# Patient Record
Sex: Male | Born: 1935 | Race: White | Hispanic: No | Marital: Married | Smoking: Never smoker
Health system: Southern US, Community
[De-identification: ages and names within clinical notes are randomized; demographics above are authoritative.]

## PROBLEM LIST (undated history)

## (undated) DIAGNOSIS — M5136 Other intervertebral disc degeneration, lumbar region: Secondary | ICD-10-CM

## (undated) DIAGNOSIS — R413 Other amnesia: Secondary | ICD-10-CM

## (undated) HISTORY — DX: Other intervertebral disc degeneration, lumbar region: M51.36

## (undated) HISTORY — DX: Other amnesia: R41.3

---

## 2013-05-09 ENCOUNTER — Emergency Department
Admission: EM | Admit: 2013-05-09 | Discharge: 2013-05-09 | Disposition: A | Discharge status: Home / Self Care | Payer: Commercial Managed Care - HMO | Source: Home / Self Care

## 2013-05-10 ENCOUNTER — Ambulatory Visit (INDEPENDENT_AMBULATORY_CARE_PROVIDER_SITE_OTHER): Payer: Medicare HMO | Admitting: Sports Medicine

## 2013-05-10 ENCOUNTER — Ambulatory Visit (INDEPENDENT_AMBULATORY_CARE_PROVIDER_SITE_OTHER): Payer: Medicare HMO

## 2013-05-10 ENCOUNTER — Encounter: Payer: Self-pay | Admitting: Sports Medicine

## 2013-05-10 VITALS — BP 115/67 | HR 82 | Ht 72.0 in | Wt 199.0 lb

## 2013-05-10 DIAGNOSIS — M545 Low back pain, unspecified: Secondary | ICD-10-CM

## 2013-05-10 DIAGNOSIS — M5136 Other intervertebral disc degeneration, lumbar region: Secondary | ICD-10-CM

## 2013-05-10 DIAGNOSIS — IMO0002 Reserved for concepts with insufficient information to code with codable children: Secondary | ICD-10-CM

## 2013-05-10 DIAGNOSIS — M51369 Other intervertebral disc degeneration, lumbar region without mention of lumbar back pain or lower extremity pain: Secondary | ICD-10-CM | POA: Insufficient documentation

## 2013-05-10 DIAGNOSIS — M47817 Spondylosis without myelopathy or radiculopathy, lumbosacral region: Secondary | ICD-10-CM

## 2013-05-10 HISTORY — DX: Other intervertebral disc degeneration, lumbar region without mention of lumbar back pain or lower extremity pain: M51.369

## 2013-05-10 HISTORY — DX: Other intervertebral disc degeneration, lumbar region: M51.36

## 2013-05-10 MED ORDER — PREDNISONE 50 MG PO TABS: ORAL_TABLET | ORAL | Status: DC

## 2013-05-10 MED ORDER — MELOXICAM 15 MG PO TABS: ORAL_TABLET | ORAL | Status: DC

## 2013-05-10 NOTE — Progress Notes (Signed)
   Subjective:    [name] seeing this patient as a consultation for:  Dr. Alvester MorinNewton  CC: Low back pain  HPI: For decades this pleasant 80 year old male has noted pain in his low back worse with standing and twisting,male is in pain he localizes in the midline of his low back, he denies any radicular symptoms, no trauma, no constitutional symptoms. He is really not tried anything yet for treatment. It is not worse with sitting in a car or bending forward or with Valsalva.  Past medical history, Surgical history, Family history not pertinant except as noted below, Social history, Allergies, and medications have been entered into the medical record, reviewed, and no changes needed.   Review of Systems: No headache, visual changes, nausea, vomiting, diarrhea, constipation, dizziness, abdominal pain, skin rash, fevers, chills, night sweats, weight loss, swollen lymph nodes, body aches, joint swelling, muscle aches, chest pain, shortness of breath, mood changes, visual or auditory hallucinations.   Objective:   General: Well Developed, well nourished, and in no acute distress.  Neuro/Psych: Alert and oriented x3, extra-ocular muscles intact, able to move all 4 extremities, sensation grossly intact. Skin: Warm and dry, no rashes noted.  Respiratory: Not using accessory muscles, speaking in full sentences, trachea midline.  Cardiovascular: Pulses palpable, no extremity edema. Abdomen: Does not appear distended. Back Exam:  Inspection: Unremarkable  Motion: Flexion 45 deg, Extension 45 deg, Side Bending to 45 deg bilaterally,  Rotation to 45 deg bilaterally  SLR laying: Negative  XSLR laying: Negative  Palpable tenderness: None. FABER: negative. Sensory change: Gross sensation intact to all lumbar and sacral dermatomes.  Reflexes: 2+ at both patellar tendons, 2+ at achilles tendons, Babinski's downgoing.  Strength at foot  Plantar-flexion: 5/5 Dorsi-flexion: 5/5 Eversion: 5/5 Inversion: 5/5  Leg  strength  Quad: 5/5 Hamstring: 5/5 Hip flexor: 5/5 Hip abductors: 5/5  Gait unremarkable.   x-rays were reviewed and do show multilevel degenerative changes.   Impression and Recommendations:   This case required medical decision making of moderate complexity.

## 2013-05-10 NOTE — Assessment & Plan Note (Signed)
Symptoms are likely facetogenic predominantly axial with radiation only down to the posterior thigh. We will start conservatively with Mobic, prednisone, formal physical therapy, and x-rays. Return to see me in one month, MRI if no better.

## 2013-05-21 ENCOUNTER — Ambulatory Visit: Payer: Medicare HMO | Admitting: Physical Therapy

## 2013-05-28 ENCOUNTER — Ambulatory Visit (INDEPENDENT_AMBULATORY_CARE_PROVIDER_SITE_OTHER): Payer: Medicare HMO | Admitting: Physical Therapy

## 2013-05-28 DIAGNOSIS — M6281 Muscle weakness (generalized): Secondary | ICD-10-CM

## 2013-05-28 DIAGNOSIS — M545 Low back pain, unspecified: Secondary | ICD-10-CM

## 2013-05-28 DIAGNOSIS — M25659 Stiffness of unspecified hip, not elsewhere classified: Secondary | ICD-10-CM

## 2013-05-30 ENCOUNTER — Encounter (INDEPENDENT_AMBULATORY_CARE_PROVIDER_SITE_OTHER): Payer: Medicare HMO

## 2013-05-30 DIAGNOSIS — M545 Low back pain, unspecified: Secondary | ICD-10-CM

## 2013-05-30 DIAGNOSIS — M25659 Stiffness of unspecified hip, not elsewhere classified: Secondary | ICD-10-CM

## 2013-05-30 DIAGNOSIS — M6281 Muscle weakness (generalized): Secondary | ICD-10-CM

## 2013-06-03 ENCOUNTER — Encounter (INDEPENDENT_AMBULATORY_CARE_PROVIDER_SITE_OTHER): Payer: Medicare HMO | Admitting: Physical Therapy

## 2013-06-03 DIAGNOSIS — M6281 Muscle weakness (generalized): Secondary | ICD-10-CM

## 2013-06-03 DIAGNOSIS — M25659 Stiffness of unspecified hip, not elsewhere classified: Secondary | ICD-10-CM

## 2013-06-03 DIAGNOSIS — M545 Low back pain, unspecified: Secondary | ICD-10-CM

## 2013-06-06 ENCOUNTER — Encounter (INDEPENDENT_AMBULATORY_CARE_PROVIDER_SITE_OTHER): Payer: Medicare HMO | Admitting: Physical Therapy

## 2013-06-06 DIAGNOSIS — M545 Low back pain, unspecified: Secondary | ICD-10-CM

## 2013-06-06 DIAGNOSIS — M6281 Muscle weakness (generalized): Secondary | ICD-10-CM

## 2013-06-06 DIAGNOSIS — M25659 Stiffness of unspecified hip, not elsewhere classified: Secondary | ICD-10-CM

## 2013-06-07 ENCOUNTER — Encounter: Payer: Self-pay | Admitting: Sports Medicine

## 2013-06-07 ENCOUNTER — Ambulatory Visit (INDEPENDENT_AMBULATORY_CARE_PROVIDER_SITE_OTHER): Payer: Medicare HMO | Admitting: Sports Medicine

## 2013-06-07 VITALS — BP 122/74 | HR 78 | Ht 72.0 in | Wt 195.0 lb

## 2013-06-07 DIAGNOSIS — M545 Low back pain, unspecified: Secondary | ICD-10-CM

## 2013-06-07 DIAGNOSIS — IMO0001 Reserved for inherently not codable concepts without codable children: Secondary | ICD-10-CM

## 2013-06-07 NOTE — Progress Notes (Signed)
  Subjective:    CC: Followup  HPI: Axial low back pain: Initially symptoms predominately facetogenic, he has been through steroids, NSAIDs, muscle relaxers, and formal physical therapy, he continues to improve, only has mild pain he localizes on the right side of the low back. No constitutional symptoms, no radicular symptoms, mild, improving.  Past medical history, Surgical history, Family history not pertinant except as noted below, Social history, Allergies, and medications have been entered into the medical record, reviewed, and no changes needed.   Review of Systems: No fevers, chills, night sweats, weight loss, chest pain, or shortness of breath.   Objective:    General: Well Developed, well nourished, and in no acute distress.  Neuro: Alert and oriented x3, extra-ocular muscles intact, sensation grossly intact.  HEENT: Normocephalic, atraumatic, pupils equal round reactive to light, neck supple, no masses, no lymphadenopathy, thyroid nonpalpable.  Skin: Warm and dry, no rashes. Cardiac: Regular rate and rhythm, no murmurs rubs or gallops, no lower extremity edema.  Respiratory: Clear to auscultation bilaterally. Not using accessory muscles, speaking in full sentences. Back Exam:  Inspection: Unremarkable  Motion: Flexion 45 deg, Extension 45 deg, Side Bending to 45 deg bilaterally,  Rotation to 45 deg bilaterally  SLR laying: Negative  XSLR laying: Negative  Palpable tenderness: Right sided lower lumbar spine likely over the L5-S1 facet joint. FABER: negative. Sensory change: Gross sensation intact to all lumbar and sacral dermatomes.  Reflexes: 2+ at both patellar tendons, 2+ at achilles tendons, Babinski's downgoing.  Strength at foot  Plantar-flexion: 5/5 Dorsi-flexion: 5/5 Eversion: 5/5 Inversion: 5/5  Leg strength  Quad: 5/5 Hamstring: 5/5 Hip flexor: 5/5 Hip abductors: 5/5  Gait unremarkable.  Procedure:  Injection of right paraspinal trigger point Consent obtained  and verified. Time-out conducted. Noted no overlying erythema, induration, or other signs of local infection. Skin prepped in a sterile fashion. Topical analgesic spray: Ethyl chloride. Completed without difficulty. Meds: 0.5 cc Kenalog 40, 1 cc lidocaine injected in a fanlike pattern into the trigger point overlying the right sided L5-S1 facet joint.  Pain immediately improved suggesting accurate placement of the medication. Advised to call if fevers/chills, erythema, induration, drainage, or persistent bleeding.  Impression and Recommendations:

## 2013-06-07 NOTE — Assessment & Plan Note (Signed)
Pain continues to be axial and likely facet mediated. He does have discrete tenderness to palpation of the right L5-S1 versus L4-L5 facet joint. Injection, trigger point placed in the overlying tissues. He still has a bit more physical therapy and continues to improve, I like to see him back in an additional 3 weeks. If no better we will get an MRI for facet joint injection planning.

## 2013-06-11 ENCOUNTER — Encounter: Payer: Medicare HMO | Admitting: Physical Therapy

## 2013-06-13 ENCOUNTER — Encounter (INDEPENDENT_AMBULATORY_CARE_PROVIDER_SITE_OTHER): Payer: Medicare HMO | Admitting: Physical Therapy

## 2013-06-13 DIAGNOSIS — M545 Low back pain, unspecified: Secondary | ICD-10-CM

## 2013-06-13 DIAGNOSIS — M25659 Stiffness of unspecified hip, not elsewhere classified: Secondary | ICD-10-CM

## 2013-06-13 DIAGNOSIS — M6281 Muscle weakness (generalized): Secondary | ICD-10-CM

## 2013-06-19 ENCOUNTER — Encounter (INDEPENDENT_AMBULATORY_CARE_PROVIDER_SITE_OTHER): Payer: Medicare HMO | Admitting: Physical Therapy

## 2013-06-19 DIAGNOSIS — M25659 Stiffness of unspecified hip, not elsewhere classified: Secondary | ICD-10-CM

## 2013-06-19 DIAGNOSIS — M6281 Muscle weakness (generalized): Secondary | ICD-10-CM

## 2013-06-19 DIAGNOSIS — M545 Low back pain, unspecified: Secondary | ICD-10-CM

## 2013-06-20 ENCOUNTER — Encounter: Payer: Medicare HMO | Admitting: Physical Therapy

## 2013-06-21 ENCOUNTER — Encounter: Payer: Medicare HMO | Admitting: Physical Therapy

## 2013-06-21 ENCOUNTER — Ambulatory Visit (INDEPENDENT_AMBULATORY_CARE_PROVIDER_SITE_OTHER): Payer: Medicare HMO | Admitting: Sports Medicine

## 2013-06-21 ENCOUNTER — Encounter: Payer: Self-pay | Admitting: Sports Medicine

## 2013-06-21 VITALS — BP 102/59 | HR 90 | Ht 72.0 in | Wt 194.0 lb

## 2013-06-21 DIAGNOSIS — M545 Low back pain, unspecified: Secondary | ICD-10-CM

## 2013-06-21 MED ORDER — HYDROCODONE-ACETAMINOPHEN 5-325 MG PO TABS: 1.0000 | ORAL_TABLET | Freq: Three times a day (TID) | ORAL | Status: DC | PRN

## 2013-06-21 NOTE — Assessment & Plan Note (Signed)
Pain continue to be axial and clinically predominately facetogenic. He has been through physical therapy unfortunately pain is worsening, I am going to add a very small course of hydrocodone, and we are going to get an MRI for interventional injection planning.

## 2013-06-21 NOTE — Progress Notes (Signed)
  Subjective:    CC: Follow up  HPI: Juan Carrillo is a pleasant 80 year old male, he has axial, predominantly facetogenic low back pain, we placed him through physical therapy, unfortunately 2 weeks later he is having worsening pain despite all of the oral medications that have been prescribed. Pain continue to be moderate and persistent, he is requesting hydrocodone today.  Past medical history, Surgical history, Family history not pertinant except as noted below, Social history, Allergies, and medications have been entered into the medical record, reviewed, and no changes needed.   Review of Systems: No fevers, chills, night sweats, weight loss, chest pain, or shortness of breath.   Objective:    General: Well Developed, well nourished, and in no acute distress.  Neuro: Alert and oriented x3, extra-ocular muscles intact, sensation grossly intact.  HEENT: Normocephalic, atraumatic, pupils equal round reactive to light, neck supple, no masses, no lymphadenopathy, thyroid nonpalpable.  Skin: Warm and dry, no rashes. Cardiac: Regular rate and rhythm, no murmurs rubs or gallops, no lower extremity edema.  Respiratory: Clear to auscultation bilaterally. Not using accessory muscles, speaking in full sentences.  Impression and Recommendations:

## 2013-06-24 ENCOUNTER — Encounter: Payer: Medicare HMO | Admitting: Physical Therapy

## 2013-06-26 ENCOUNTER — Telehealth: Payer: Self-pay | Admitting: *Deleted

## 2013-06-26 NOTE — Telephone Encounter (Signed)
Precert obtained for MRI Lumbar Spine without contrast through [facility].  Auth # [id number] good from 06/26/2013 to 07/26/2013.  Imaging notified. Barry DienesKimberly Elyssia Strausser, LPN

## 2013-07-01 ENCOUNTER — Ambulatory Visit: Payer: Medicare HMO | Admitting: Family Medicine

## 2013-07-10 ENCOUNTER — Ambulatory Visit (INDEPENDENT_AMBULATORY_CARE_PROVIDER_SITE_OTHER): Payer: Medicare HMO

## 2013-07-10 DIAGNOSIS — M79609 Pain in unspecified limb: Secondary | ICD-10-CM

## 2013-07-10 DIAGNOSIS — M545 Low back pain, unspecified: Secondary | ICD-10-CM

## 2013-07-25 ENCOUNTER — Ambulatory Visit (INDEPENDENT_AMBULATORY_CARE_PROVIDER_SITE_OTHER): Payer: Medicare HMO | Admitting: Sports Medicine

## 2013-07-25 ENCOUNTER — Encounter: Payer: Self-pay | Admitting: Sports Medicine

## 2013-07-25 VITALS — BP 89/55 | HR 90 | Ht 71.0 in | Wt 192.0 lb

## 2013-07-25 DIAGNOSIS — M51379 Other intervertebral disc degeneration, lumbosacral region without mention of lumbar back pain or lower extremity pain: Secondary | ICD-10-CM

## 2013-07-25 DIAGNOSIS — M5137 Other intervertebral disc degeneration, lumbosacral region: Secondary | ICD-10-CM

## 2013-07-25 DIAGNOSIS — M51369 Other intervertebral disc degeneration, lumbar region without mention of lumbar back pain or lower extremity pain: Secondary | ICD-10-CM

## 2013-07-25 DIAGNOSIS — M5136 Other intervertebral disc degeneration, lumbar region: Secondary | ICD-10-CM

## 2013-07-25 MED ORDER — HYDROCODONE-ACETAMINOPHEN 5-325 MG PO TABS: 1.0000 | ORAL_TABLET | Freq: Three times a day (TID) | ORAL | Status: DC | PRN

## 2013-07-25 NOTE — Assessment & Plan Note (Signed)
Juan Carrillo has persistent axial and discogenic low back pain. At this point he has failed conservative measures and we are going to proceed with intervention. This will take place as a right-sided L2-L3 and L4-L5 interlaminar epidural injections.  Ideally these will be done on Monday, Jul 29, 2013 Like to see him back 2 weeks after the injection to evaluate his response.

## 2013-07-25 NOTE — Progress Notes (Signed)
  Subjective:    CC: MRI results  HPI: This pleasant [id number] year old male has lumbar degenerative disc disease. He failed physical therapy, steroids, NSAIDs, we recently obtained an MRI, results of which will be dictated below. Pain is moderate, persistent, axial, worse with sitting and Valsalva.  Past medical history, Surgical history, Family history not pertinant except as noted below, Social history, Allergies, and medications have been entered into the medical record, reviewed, and no changes needed.   Review of Systems: No fevers, chills, night sweats, weight loss, chest pain, or shortness of breath.   Objective:    General: Well Developed, well nourished, and in no acute distress.  Neuro: Alert and oriented x3, extra-ocular muscles intact, sensation grossly intact.  HEENT: Normocephalic, atraumatic, pupils equal round reactive to light, neck supple, no masses, no lymphadenopathy, thyroid nonpalpable.  Skin: Warm and dry, no rashes. Cardiac: Regular rate and rhythm, no murmurs rubs or gallops, no lower extremity edema.  Respiratory: Clear to auscultation bilaterally. Not using accessory muscles, speaking in full sentences.  MRI was reviewed and shows multilevel degenerative disc disease with predominant central spinal stenosis at L2-3, L4-5, and L5-S1.  Impression and Recommendations:

## 2013-08-06 ENCOUNTER — Other Ambulatory Visit: Payer: Self-pay | Admitting: Sports Medicine

## 2013-08-06 ENCOUNTER — Ambulatory Visit
Admission: RE | Admit: 2013-08-06 | Discharge: 2013-08-06 | Disposition: A | Discharge status: Home / Self Care | Payer: Commercial Managed Care - HMO | Source: Ambulatory Visit | Attending: Sports Medicine | Admitting: Sports Medicine

## 2013-08-06 DIAGNOSIS — M5136 Other intervertebral disc degeneration, lumbar region: Secondary | ICD-10-CM

## 2013-08-06 MED ORDER — METHYLPREDNISOLONE ACETATE 40 MG/ML INJ SUSP (RADIOLOG
120.0000 mg | Freq: Once | INTRAMUSCULAR | Status: AC
  Administered 2013-08-06: 120 mg via EPIDURAL

## 2013-08-06 MED ORDER — IOHEXOL 180 MG/ML  SOLN
1.0000 mL | Freq: Once | INTRAMUSCULAR | Status: AC | PRN
  Administered 2013-08-06: 1 mL via EPIDURAL

## 2013-08-06 NOTE — Discharge Instructions (Signed)

## 2013-08-12 ENCOUNTER — Ambulatory Visit: Payer: Medicare HMO | Admitting: Sports Medicine

## 2013-10-07 ENCOUNTER — Encounter: Payer: Self-pay | Admitting: Sports Medicine

## 2013-10-07 ENCOUNTER — Ambulatory Visit (INDEPENDENT_AMBULATORY_CARE_PROVIDER_SITE_OTHER): Payer: Medicare HMO | Admitting: Sports Medicine

## 2013-10-07 VITALS — BP 103/57 | HR 95 | Ht 71.0 in | Wt 195.0 lb

## 2013-10-07 DIAGNOSIS — M51369 Other intervertebral disc degeneration, lumbar region without mention of lumbar back pain or lower extremity pain: Secondary | ICD-10-CM

## 2013-10-07 DIAGNOSIS — M51379 Other intervertebral disc degeneration, lumbosacral region without mention of lumbar back pain or lower extremity pain: Secondary | ICD-10-CM

## 2013-10-07 DIAGNOSIS — M5137 Other intervertebral disc degeneration, lumbosacral region: Secondary | ICD-10-CM

## 2013-10-07 DIAGNOSIS — M5136 Other intervertebral disc degeneration, lumbar region: Secondary | ICD-10-CM

## 2013-10-07 MED ORDER — HYDROCODONE-ACETAMINOPHEN 5-325 MG PO TABS: 1.0000 | ORAL_TABLET | Freq: Three times a day (TID) | ORAL | Status: DC | PRN

## 2013-10-07 MED ORDER — MELOXICAM 15 MG PO TABS: ORAL_TABLET | ORAL | Status: DC

## 2013-10-07 NOTE — Progress Notes (Signed)
  Subjective:    CC: Followup  HPI: This is a very pleasant 80 year old male with lumbar degenerative disc disease, approximately 3 months ago he had an L2-L3 and L4-L5 interlaminar epidural injection, he had a fantastic response and desires a repeat. He is only taking a single hydrocodone per day, with meloxicam and feels as though this is a good regimen for him. Symptoms are moderate, persistent.  Past medical history, Surgical history, Family history not pertinant except as noted below, Social history, Allergies, and medications have been entered into the medical record, reviewed, and no changes needed.   Review of Systems: No fevers, chills, night sweats, weight loss, chest pain, or shortness of breath.   Objective:    General: Well Developed, well nourished, and in no acute distress.  Neuro: Alert and oriented x3, extra-ocular muscles intact, sensation grossly intact.  HEENT: Normocephalic, atraumatic, pupils equal round reactive to light, neck supple, no masses, no lymphadenopathy, thyroid nonpalpable.  Skin: Warm and dry, no rashes. Cardiac: Regular rate and rhythm, no murmurs rubs or gallops, no lower extremity edema.  Respiratory: Clear to auscultation bilaterally. Not using accessory muscles, speaking in full sentences. Back Exam:  Inspection: Unremarkable  Motion: Flexion 45 deg, Extension 45 deg, Side Bending to 45 deg bilaterally,  Rotation to 45 deg bilaterally  SLR laying: Negative  XSLR laying: Negative  Palpable tenderness: None. FABER: negative. Sensory change: Gross sensation intact to all lumbar and sacral dermatomes.  Reflexes: 2+ at both patellar tendons, 2+ at achilles tendons, Babinski's downgoing.  Strength at foot  Plantar-flexion: 5/5 Dorsi-flexion: 5/5 Eversion: 5/5 Inversion: 5/5  Leg strength  Quad: 5/5 Hamstring: 5/5 Hip flexor: 5/5 Hip abductors: 5/5  Gait unremarkable.  Impression and Recommendations:

## 2013-10-07 NOTE — Assessment & Plan Note (Signed)
Excellent response, with 3 months of relief after a right-sided L2-L3 and L4-L5 interlaminar epidural. We are going to repeat the epidurals, we can do 6 of these in one year. Also refilling meloxicam and hydrocodone for use up to one time per day.

## 2013-10-16 ENCOUNTER — Other Ambulatory Visit: Payer: Commercial Managed Care - HMO

## 2013-10-17 ENCOUNTER — Other Ambulatory Visit: Payer: Self-pay | Admitting: Sports Medicine

## 2013-10-17 ENCOUNTER — Ambulatory Visit
Admission: RE | Admit: 2013-10-17 | Discharge: 2013-10-17 | Disposition: A | Discharge status: Home / Self Care | Payer: Commercial Managed Care - HMO | Source: Ambulatory Visit | Attending: Sports Medicine | Admitting: Sports Medicine

## 2013-10-17 DIAGNOSIS — M5136 Other intervertebral disc degeneration, lumbar region: Secondary | ICD-10-CM

## 2013-10-17 MED ORDER — METHYLPREDNISOLONE ACETATE 40 MG/ML INJ SUSP (RADIOLOG
120.0000 mg | Freq: Once | INTRAMUSCULAR | Status: AC
  Administered 2013-10-17: 120 mg via EPIDURAL

## 2013-10-17 MED ORDER — IOHEXOL 180 MG/ML  SOLN
1.0000 mL | Freq: Once | INTRAMUSCULAR | Status: AC | PRN
  Administered 2013-10-17: 1 mL via EPIDURAL

## 2015-06-02 DIAGNOSIS — R413 Other amnesia: Secondary | ICD-10-CM

## 2015-06-02 HISTORY — DX: Other amnesia: R41.3

## 2015-12-10 ENCOUNTER — Encounter: Payer: Self-pay | Admitting: Sports Medicine

## 2015-12-10 ENCOUNTER — Ambulatory Visit (INDEPENDENT_AMBULATORY_CARE_PROVIDER_SITE_OTHER): Payer: Commercial Managed Care - HMO | Admitting: Sports Medicine

## 2015-12-10 DIAGNOSIS — M5136 Other intervertebral disc degeneration, lumbar region: Secondary | ICD-10-CM

## 2015-12-10 DIAGNOSIS — M51369 Other intervertebral disc degeneration, lumbar region without mention of lumbar back pain or lower extremity pain: Secondary | ICD-10-CM

## 2015-12-10 MED ORDER — PREDNISONE 50 MG PO TABS: 50.0000 mg | ORAL_TABLET | Freq: Every day | ORAL | 0 refills | Status: DC

## 2015-12-10 NOTE — Assessment & Plan Note (Signed)
Good relief after right-sided L2-L3 and L4-L5 interlaminar epidurals 2 years ago. Now having a recurrence of pain, likely radicular on the left in an L4 versus an L5 distribution. Continue meloxicam, adding prednisone for 5 days. I am happy to proceed with an epidural should he desire.

## 2015-12-10 NOTE — Progress Notes (Signed)
  Subjective:    CC: Follow-up  HPI: Left thigh pain: History of fairly severe lumbar degenerative disc disease and facet arthritis. Really has no back pain, pain is moderate, persistent and localized in the lateral and anterior left thigh, mid femur. No bowel or bladder dysfunction, saddle numbness, constitutional symptoms.  Past medical history, Surgical history, Family history not pertinant except as noted below, Social history, Allergies, and medications have been entered into the medical record, reviewed, and no changes needed.   Review of Systems: No fevers, chills, night sweats, weight loss, chest pain, or shortness of breath.   Objective:    General: Well Developed, well nourished, and in no acute distress.  Neuro: Alert and oriented x3, extra-ocular muscles intact, sensation grossly intact.  HEENT: Normocephalic, atraumatic, pupils equal round reactive to light, neck supple, no masses, no lymphadenopathy, thyroid nonpalpable.  Skin: Warm and dry, no rashes. Cardiac: Regular rate and rhythm, no murmurs rubs or gallops, no lower extremity edema.  Respiratory: Clear to auscultation bilaterally. Not using accessory muscles, speaking in full sentences. Hip: ROM IR: 60 Deg, ER: 60 Deg, Flexion: 120 Deg, Extension: 100 Deg, Abduction: 45 Deg, Adduction: 45 Deg Strength IR: 5/5, ER: 5/5, Flexion: 5/5, Extension: 5/5, Abduction: 5/5, Adduction: 5/5 Pelvic alignment unremarkable to inspection and palpation. Standing hip rotation and gait without trendelenburg / unsteadiness. Greater trochanter without tenderness to palpation. No tenderness over piriformis. No SI joint tenderness and normal minimal SI movement. Back Exam:  Inspection: Unremarkable  Motion: Flexion 45 deg, Extension 45 deg, Side Bending to 45 deg bilaterally,  Rotation to 45 deg bilaterally  SLR laying: Negative  XSLR laying: Negative  Palpable tenderness: None. FABER: negative. Sensory change: Gross sensation  intact to all lumbar and sacral dermatomes.  Reflexes: 2+ at both patellar tendons, 2+ at achilles tendons, Babinski's downgoing.  Strength at foot  Plantar-flexion: 5/5 Dorsi-flexion: 5/5 Eversion: 5/5 Inversion: 5/5  Leg strength  Quad: 5/5 Hamstring: 5/5 Hip flexor: 5/5 Hip abductors: 5/5  Gait unremarkable.  MRI again personally reviewed and shows widespread degenerative disc disease with broad-based protrusions from L2-L5, with facet arthritis bilaterally in both levels, there is an element of neuroforaminal stenosis at all levels.  Impression and Recommendations:    Lumbar degenerative disc disease Good relief after right-sided L2-L3 and L4-L5 interlaminar epidurals 2 years ago. Now having a recurrence of pain, likely radicular on the left in an L4 versus an L5 distribution. Continue meloxicam, adding prednisone for 5 days. I am happy to proceed with an epidural should he desire.  I spent 25 minutes with this patient, greater than 50% was face-to-face time counseling regarding the above diagnoses

## 2015-12-25 ENCOUNTER — Telehealth: Payer: Self-pay

## 2015-12-25 DIAGNOSIS — M5416 Radiculopathy, lumbar region: Secondary | ICD-10-CM

## 2015-12-25 MED ORDER — DIAZEPAM 5 MG PO TABS: ORAL_TABLET | ORAL | 0 refills | Status: DC

## 2015-12-25 NOTE — Telephone Encounter (Signed)
Perception for Valium is in my box. 

## 2015-12-25 NOTE — Telephone Encounter (Signed)
VM left stating pt would like another epidural. Please assist

## 2015-12-25 NOTE — Telephone Encounter (Signed)
Wife notified.  She is asking for a some valium for husband to take before his epidural injection and also something for pain. She said he has an old Rx for hydrocodone but only have 2 pills left please advise.

## 2015-12-25 NOTE — Telephone Encounter (Signed)
Left vm

## 2015-12-25 NOTE — Telephone Encounter (Signed)
Epidurals ordered.  

## 2016-01-14 ENCOUNTER — Ambulatory Visit
Admission: RE | Admit: 2016-01-14 | Discharge: 2016-01-14 | Disposition: A | Discharge status: Home / Self Care | Payer: Medicare HMO | Source: Ambulatory Visit | Attending: Sports Medicine | Admitting: Sports Medicine

## 2016-01-14 ENCOUNTER — Other Ambulatory Visit: Payer: Self-pay | Admitting: Sports Medicine

## 2016-01-14 DIAGNOSIS — M5416 Radiculopathy, lumbar region: Secondary | ICD-10-CM

## 2016-01-14 MED ORDER — METHYLPREDNISOLONE ACETATE 40 MG/ML INJ SUSP (RADIOLOG
120.0000 mg | Freq: Once | INTRAMUSCULAR | Status: AC
Start: 2016-01-14 — End: 2016-01-14
  Administered 2016-01-14: 120 mg via EPIDURAL

## 2016-01-14 MED ORDER — IOPAMIDOL (ISOVUE-M 200) INJECTION 41%
1.0000 mL | Freq: Once | INTRAMUSCULAR | Status: AC
  Administered 2016-01-14: 1 mL via EPIDURAL

## 2016-01-14 NOTE — Discharge Instructions (Signed)

## 2016-02-04 ENCOUNTER — Telehealth: Payer: Self-pay

## 2016-02-04 DIAGNOSIS — M5136 Other intervertebral disc degeneration, lumbar region: Secondary | ICD-10-CM

## 2016-02-04 DIAGNOSIS — M51369 Other intervertebral disc degeneration, lumbar region without mention of lumbar back pain or lower extremity pain: Secondary | ICD-10-CM

## 2016-02-04 NOTE — Telephone Encounter (Signed)
Wife of patient called saying pt pain is returning and needs about injection. Please advise.

## 2016-02-04 NOTE — Telephone Encounter (Signed)
Orders placed.

## 2016-02-05 NOTE — Telephone Encounter (Signed)
Patient's wife advised. [facility] notified.

## 2016-02-15 ENCOUNTER — Ambulatory Visit
Admission: RE | Admit: 2016-02-15 | Discharge: 2016-02-15 | Disposition: A | Discharge status: Home / Self Care | Payer: Medicare HMO | Source: Ambulatory Visit | Attending: Sports Medicine | Admitting: Sports Medicine

## 2016-02-15 MED ORDER — METHYLPREDNISOLONE ACETATE 40 MG/ML INJ SUSP (RADIOLOG
120.0000 mg | Freq: Once | INTRAMUSCULAR | Status: AC
  Administered 2016-02-15: 120 mg via EPIDURAL

## 2016-02-15 MED ORDER — IOPAMIDOL (ISOVUE-M 200) INJECTION 41%
1.0000 mL | Freq: Once | INTRAMUSCULAR | Status: AC
  Administered 2016-02-15: 1 mL via EPIDURAL

## 2016-02-15 NOTE — Discharge Instructions (Signed)

## 2016-05-17 ENCOUNTER — Ambulatory Visit: Payer: Medicare HMO | Admitting: Sports Medicine

## 2016-05-17 ENCOUNTER — Ambulatory Visit (INDEPENDENT_AMBULATORY_CARE_PROVIDER_SITE_OTHER): Payer: Medicare HMO | Admitting: Family Medicine

## 2016-05-17 ENCOUNTER — Encounter: Payer: Self-pay | Admitting: Family Medicine

## 2016-05-17 VITALS — BP 93/68 | HR 103 | Wt 181.0 lb

## 2016-05-17 DIAGNOSIS — M5136 Other intervertebral disc degeneration, lumbar region: Secondary | ICD-10-CM | POA: Diagnosis not present

## 2016-05-17 DIAGNOSIS — M62838 Other muscle spasm: Secondary | ICD-10-CM | POA: Insufficient documentation

## 2016-05-17 MED ORDER — DICLOFENAC SODIUM 1 % TD GEL: 2.0000 g | Freq: Four times a day (QID) | TRANSDERMAL | 11 refills | Status: DC

## 2016-05-17 MED ORDER — HYDROCODONE-ACETAMINOPHEN 5-325 MG PO TABS: 1.0000 | ORAL_TABLET | Freq: Four times a day (QID) | ORAL | 0 refills | Status: DC | PRN

## 2016-05-17 NOTE — Progress Notes (Signed)
Juan Carrillo is a 83 y.o. male who presents to [facility] today for neck pain. Patient is an 83 year old male with a history of dementia and lumbar DDD. He was in his normal state of health until about 3 weeks ago when he started an exercise program with a personal trainer at the gym. He noted worsening neck pain after doing a boxing routine. He denies any acute injuries radiating pain weakness or numbness. The pain has been severe at times and located to the bilateral lateral neck. He was seen in the emergency department where CT scan showed degenerative disc disease of the cervical spine but otherwise was unremarkable. He was given prednisone and codeine and acetaminophen.  He notes this is help with the pain is continued for the last 2 weeks or so.  He has been previously seen by Dr. Benjamin Stain at this practice for his lumbar pain.   Past Medical History:  Diagnosis Date  . Lumbar degenerative disc disease 05/10/2013  . Memory loss 06/02/2015   No past surgical history on file. Social History  Substance Use Topics  . Smoking status: Never Smoker  . Smokeless tobacco: Not on file  . Alcohol use Not on file   family history is not on file.  ROS:  No headache, visual changes, nausea, vomiting, diarrhea, constipation, dizziness, abdominal pain, skin rash, fevers, chills, night sweats, weight loss, swollen lymph nodes, body aches, joint swelling, muscle aches, chest pain, shortness of breath, mood changes, visual or auditory hallucinations.    Medications: Current Outpatient Prescriptions  Medication Sig Dispense Refill  . HYDROcodone-acetaminophen (NORCO/VICODIN) 5-325 MG tablet Take 1 tablet by mouth every 6 (six) hours as needed for moderate pain. 20 tablet 0  . diclofenac sodium (VOLTAREN) 1 % GEL Apply 2 g topically 4 (four) times daily. To affected joint. 100 g 11   No current facility-administered medications for this visit.    No  Known Allergies   Exam:  BP 93/68   Pulse (!) 103   Wt 181 lb (82.1 kg)   BMI 25.24 kg/m  General: Well Developed, well nourished, and in no acute distress.  Neuro/Psych: Alert and oriented x3, extra-ocular muscles intact, able to move all 4 extremities, sensation grossly intact. Skin: Warm and dry, no rashes noted.  Respiratory: Not using accessory muscles, speaking in full sentences, trachea midline.  Cardiovascular: Pulses palpable, no extremity edema. Abdomen: Does not appear distended. MSK: C-spine is nontender to midline. Tender palpation bilateral cervical paraspinal muscles and trapezius. Decreased extension rotation and lateral flexion bilaterally. Upper extremity strength is equal and normal throughout along with normal reflexes and sensation.  Patient ambulates with a walker with a extended neck position due to lumbar and thoracic flexion.   CT Head Wo Contrast Cervical Spine Wo Contrast2/06/2016 [facility] Result Impression  1. No acute intracranial abnormality. 2. No fracture or traumatic malalignment in the cervical spine. 3. Multilevel degenerative changes. Multilevel neural foraminal narrowing most marked on the left at C2-3 and C3-4.   Electronically Signed By: Arline Asp M.D On: 05/01/2016 12:00  Result Narrative  CLINICAL DATA:Neck pain for 2 days.No known trauma.  EXAM: CT HEAD WITHOUT CONTRAST  CT CERVICAL SPINE WITHOUT CONTRAST  TECHNIQUE: Multidetector CT imaging of the head and cervical spine was performed following the standard protocol without intravenous contrast. Multiplanar CT image reconstructions of the cervical spine were also generated.  COMPARISON:None.  FINDINGS: CT HEAD FINDINGS  Brain: No subdural, epidural, or subarachnoid hemorrhage.  Ventricles and sulci are prominent. Cerebellum, brainstem, and basal cisterns are within normal limits. No mass, mass effect, or midline shift. No acute cortical ischemia  or infarct. White matter changes are noted.  Vascular: Calcified atherosclerosis is seen in the intracranial carotid arteries.  Skull: Normal. Negative for fracture or focal lesion.  Sinuses/Orbits: No acute finding.  Other: None.  CT CERVICAL SPINE FINDINGS  Alignment: Kyphosis centered that C5-6. No traumatic malalignment identified.  Skull base and vertebrae: No fractures are identified.  Soft tissues and spinal canal: No prevertebral fluid or swelling. No visible canal hematoma.  Disc levels: Multilevel degenerative changes with anterior osteophytes and small posterior osteophytes. No critical canal narrowing identified. Facet degenerative changes are seen at multiple levels. Neural foraminal narrowing is identified at multiple levels. It is most marked on the left at C2-3 and C3-4.  Upper chest: Negative.  Other: No other abnormalities.      No results found for this or any previous visit (from the past 48 hour(s)). No results found.    Assessment and Plan: 83 y.o. male with  Cervical paraspinal and trapezius muscle spasm and pain. Patient has a history of DDD but has not had much pain in this area. His pain started after he increased his exercise. I think this is likely muscle injury and an dysfunction and spasm. I had a lengthy discussion with his family about pros and cons of various treatments. I think a more conservative approach is reasonable given the patient's advanced age for health and history of dementia. Plan to avoid long-term high-dose narcotics muscle relaxers or other sedatives if possible.  Plan to use Tylenol, diclofenac gel, heating pad and TENS unit. Use Norco sparingly for pain as needed. Additionally the most important treatment will be referral to physical therapy. Recheck in about a month or so.    Orders Placed This Encounter  Procedures  . Ambulatory referral to Physical Therapy    Referral Priority:   Routine    Referral Type:    Physical Medicine    Referral Reason:   Specialty Services Required    Requested Specialty:   Physical Therapy    Number of Visits Requested:   1   [place] substance reporting system database  Discussed warning signs or symptoms. Please see discharge instructions. Patient expresses understanding.  I spent 40 minutes with this patient, greater than 50% was face-to-face time counseling regarding the above diagnosis.

## 2016-05-17 NOTE — Patient Instructions (Addendum)
Thank you for coming in today. Use the voltaren gel  Use norco sparingly.   Use heat and TENS unit.  Attend PT.   Recheck in 4 weeks or so or sooner if needed.   TENS UNIT: This is helpful for muscle pain and spasm.   Search and Purchase a TENS 7000 2nd edition at  www.tenspros.com or www.Amazon.com It should be less than $30.     TENS unit instructions: Do not shower or bathe with the unit on Turn the unit off before removing electrodes or batteries If the electrodes lose stickiness add a drop of water to the electrodes after they are disconnected from the unit and place on plastic sheet. If you continued to have difficulty, call the TENS unit company to purchase more electrodes. Do not apply lotion on the skin area prior to use. Make sure the skin is clean and dry as this will help prolong the life of the electrodes. After use, always check skin for unusual red areas, rash or other skin difficulties. If there are any skin problems, does not apply electrodes to the same area. Never remove the electrodes from the unit by pulling the wires. Do not use the TENS unit or electrodes other than as directed. Do not change electrode placement without consultating your therapist or physician. Keep 2 fingers with between each electrode. Wear time ratio is 2:1, on to off times.    For example on for 30 minutes off for 15 minutes and then on for 30 minutes off for 15 minutes     Cervical Strain and Sprain Rehab Ask your health care provider which exercises are safe for you. Do exercises exactly as told by your health care provider and adjust them as directed. It is normal to feel mild stretching, pulling, tightness, or discomfort as you do these exercises, but you should stop right away if you feel sudden pain or your pain gets worse.Do not begin these exercises until told by your health care provider. Stretching and range of motion exercises These exercises warm up your muscles and joints  and improve the movement and flexibility of your neck. These exercises also help to relieve pain, numbness, and tingling. Exercise A: Cervical side bend 1. Using good posture, sit on a stable chair or stand up. 2. Without moving your shoulders, slowly tilt your left / right ear to your shoulder until you feel a stretch in your neck muscles. You should be looking straight ahead. 3. Hold for __________ seconds. 4. Repeat with the other side of your neck. Repeat __________ times. Complete this exercise __________ times a day. Exercise B: Cervical rotation 1. Using good posture, sit on a stable chair or stand up. 2. Slowly turn your head to the side as if you are looking over your left / right shoulder.  Keep your eyes level with the ground.  Stop when you feel a stretch along the side and the back of your neck. 3. Hold for __________ seconds. 4. Repeat this by turning to your other side. Repeat __________ times. Complete this exercise __________ times a day. Exercise C: Thoracic extension and pectoral stretch 1. Roll a towel or a small blanket so it is about 4 inches (10 cm) in diameter. 2. Lie down on your back on a firm surface. 3. Put the towel lengthwise, under your spine in the middle of your back. It should not be not under your shoulder blades. The towel should line up with your spine from your middle back  to your lower back. 4. Put your hands behind your head and let your elbows fall out to your sides. 5. Hold for __________ seconds. Repeat __________ times. Complete this exercise __________ times a day. Strengthening exercises These exercises build strength and endurance in your neck. Endurance is the ability to use your muscles for a long time, even after your muscles get tired. Exercise D: Upper cervical flexion, isometric 1. Lie on your back with a thin pillow behind your head and a small rolled-up towel under your neck. 2. Gently tuck your chin toward your chest and nod your  head down to look toward your feet. Do not lift your head off the pillow. 3. Hold for __________ seconds. 4. Release the tension slowly. Relax your neck muscles completely before you repeat this exercise. Repeat __________ times. Complete this exercise __________ times a day. Exercise E: Cervical extension, isometric 1. Stand about 6 inches (15 cm) away from a wall, with your back facing the wall. 2. Place a soft object, about 6-8 inches (15-20 cm) in diameter, between the back of your head and the wall. A soft object could be a small pillow, a ball, or a folded towel. 3. Gently tilt your head back and press into the soft object. Keep your jaw and forehead relaxed. 4. Hold for __________ seconds. 5. Release the tension slowly. Relax your neck muscles completely before you repeat this exercise. Repeat __________ times. Complete this exercise __________ times a day. Posture and body mechanics   Body mechanics refers to the movements and positions of your body while you do your daily activities. Posture is part of body mechanics. Good posture and healthy body mechanics can help to relieve stress in your body's tissues and joints. Good posture means that your spine is in its natural S-curve position (your spine is neutral), your shoulders are pulled back slightly, and your head is not tipped forward. The following are general guidelines for applying improved posture and body mechanics to your everyday activities. Standing  When standing, keep your spine neutral and keep your feet about hip-width apart. Keep a slight bend in your knees. Your ears, shoulders, and hips should line up.  When you do a task in which you stand in one place for a long time, place one foot up on a stable object that is 2-4 inches (5-10 cm) high, such as a footstool. This helps keep your spine neutral. Sitting  When sitting, keep your spine neutral and your keep feet flat on the floor. Use a footrest, if necessary, and keep  your thighs parallel to the floor. Avoid rounding your shoulders, and avoid tilting your head forward.  When working at a desk or a computer, keep your desk at a height where your hands are slightly lower than your elbows. Slide your chair under your desk so you are close enough to maintain good posture.  When working at a computer, place your monitor at a height where you are looking straight ahead and you do not have to tilt your head forward or downward to look at the screen. Resting When lying down and resting, avoid positions that are most painful for you. Try to support your neck in a neutral position. You can use a contour pillow or a small rolled-up towel. Your pillow should support your neck but not push on it. This information is not intended to replace advice given to you by your health care provider. Make sure you discuss any questions you have with your  health care provider. Document Released: 03/14/2005 Document Revised: 11/19/2015 Document Reviewed: 02/18/2015 Elsevier Interactive Patient Education  2017 [employer].

## 2016-06-10 ENCOUNTER — Telehealth: Payer: Self-pay

## 2016-06-10 DIAGNOSIS — M62838 Other muscle spasm: Secondary | ICD-10-CM

## 2016-06-10 NOTE — Telephone Encounter (Signed)
Pt called requesting a referral be placed [facility]. Pt is unaffordable at [facility] location they have been going to.

## 2016-06-13 NOTE — Telephone Encounter (Signed)
Information discussed with pt wife. Pt wife verbalized understanding

## 2016-06-13 NOTE — Telephone Encounter (Signed)
Done

## 2016-06-20 ENCOUNTER — Ambulatory Visit (INDEPENDENT_AMBULATORY_CARE_PROVIDER_SITE_OTHER): Payer: Medicare HMO | Admitting: Rehabilitative and Restorative Service Providers"

## 2016-06-20 ENCOUNTER — Encounter: Payer: Self-pay | Admitting: Rehabilitative and Restorative Service Providers"

## 2016-06-20 DIAGNOSIS — R29898 Other symptoms and signs involving the musculoskeletal system: Secondary | ICD-10-CM | POA: Diagnosis not present

## 2016-06-20 DIAGNOSIS — M6281 Muscle weakness (generalized): Secondary | ICD-10-CM | POA: Diagnosis not present

## 2016-06-20 DIAGNOSIS — R2681 Unsteadiness on feet: Secondary | ICD-10-CM | POA: Diagnosis not present

## 2016-06-20 DIAGNOSIS — M542 Cervicalgia: Secondary | ICD-10-CM | POA: Diagnosis not present

## 2016-06-20 NOTE — Patient Instructions (Signed)
Standing with good posture and alignment  Hips and knees straight; chest up; shoulders down and back Military posture Stand tall - during the commercials during a 30 minute television show   TENS UNIT: This is helpful for muscle pain and spasm.   Search and Purchase a TENS 7000 2nd edition at www.tenspros.com. It should be less than $30.     TENS unit instructions: Do not shower or bathe with the unit on Turn the unit off before removing electrodes or batteries If the electrodes lose stickiness add a drop of water to the electrodes after they are disconnected from the unit and place on plastic sheet. If you continued to have difficulty, call the TENS unit company to purchase more electrodes. Do not apply lotion on the skin area prior to use. Make sure the skin is clean and dry as this will help prolong the life of the electrodes. After use, always check skin for unusual red areas, rash or other skin difficulties. If there are any skin problems, does not apply electrodes to the same area. Never remove the electrodes from the unit by pulling the wires. Do not use the TENS unit or electrodes other than as directed. Do not change electrode placement without consultating your therapist or physician. Keep 2 fingers with between each electrode.

## 2016-06-20 NOTE — Therapy (Signed)
[facility] [address] [place], [place], [address] Phone: [phone]   Fax:  [phone]  Physical Therapy Evaluation  Patient Details  Name: [name] MRN: [id number] Date of Birth: 06/04/1924 Referring Provider: Dr Clementeen GrahamEvan Corey   Encounter Date: 06/20/2016      PT End of Session - 06/20/16 1401    Visit Number 1   Number of Visits 12   Date for PT Re-Evaluation 08/01/16   PT Start Time 1400   PT Stop Time 1511   PT Time Calculation (min) 71 min   Activity Tolerance Patient tolerated treatment well      Past Medical History:  Diagnosis Date  . Lumbar degenerative disc disease 05/10/2013  . Memory loss 06/02/2015    History reviewed. No pertinent surgical history.  There were no vitals filed for this visit.       Subjective Assessment - 06/20/16 1412    Subjective Patient's wife reports that Juan Carrillo is having severe neck pain 2/18 with patient seen in the ED and diagnosed with cervical muscle spasms. The neck pain has improved some - with patient having less sever pain. Patient was working with a Systems analystpersonal trainer prior to the onset of the neck pain. Patient was seen for 2 PT visits at an outside facility with focus on stability and standing up straight.    Pertinent History LBP; dementia; arthritic changes    How long can you sit comfortably? sits in a sofa - sleeps with head falling forward at times during the day    How long can you stand comfortably? a few minutes    How long can you walk comfortably? 5- 10 minutes - slowly    Diagnostic tests CT scan - degenerative changes in the cervical spine    Patient Stated Goals to strengthening the neck so he can avoid severe neck pain; help stability in standing and walking    Currently in Pain? No/denies   Pain Score 0-No pain   Pain Location Neck   Pain Orientation Right;Left;Lower   Pain Descriptors / Indicators Dull;Sharp;Aching;Heaviness;Tiring;Tightness    Pain Type Acute pain   Pain Onset More than a month ago   Pain Frequency Intermittent   Aggravating Factors  sitting with head forward when sleeping on the couch    Pain Relieving Factors gel; massaging; heating pad; meds            [facility] PT Assessment - 06/20/16 0001      Assessment   Medical Diagnosis paraspinal cervical muscle spasm; unsteadiness    Referring Provider Dr Clementeen GrahamEvan Corey    Onset Date/Surgical Date 05/12/16   Hand Dominance Right   Next MD Visit PRN    Prior Therapy yes - 2 appts at outside facility      Precautions   Precautions None     Restrictions   Weight Bearing Restrictions No     Balance Screen   Has the patient fallen in the past 6 months No   Has the patient had a decrease in activity level because of a fear of falling?  No   Is the patient reluctant to leave their home because of a fear of falling?  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    Leisure sedentary - sits on sofa      Observation/Other Assessments   Focus on Therapeutic Outcomes (FOTO)  59% limitation      Posture/Postural Control   Posture Comments head forward; shoulders rounded and elevated; incresaed thoracic kyphosis; forward flexed through trunk and hips; knees flexed      AROM   Overall AROM Comments bilat shoulder ROM limited at end ranges throughout but WFL's; bilat LE ROM  grossly WFL's limited at ~ (-)10-15 degrees extension    Cervical Flexion 34   Cervical Extension 19   Cervical - Right Side Bend 15   Cervical - Left Side Bend 11   Cervical - Right Rotation 46   Cervical - Left Rotation 47     Strength   Overall Strength Comments strength gross;y 4/5 to 4+/5 bilat  U/LE's      Palpation   Palpation comment muscular tightness bilat ant/lat/posterior cerivcal musculature; upper traps; leveator; pecs; mid thoracic paraspinals bilat - Rt > lt      Ambulation/Gait   Gait Comments ambulates with SPC Rt - flexed forward gait as noted above; unsteady      Berg Balance Test   Sit to Stand Able to stand  independently using hands   Standing Unsupported Able to stand safely 2 minutes   Sitting with Back Unsupported but Feet Supported on Floor or Stool Able to sit safely and securely 2 minutes   Stand to Sit Controls descent by using hands   Transfers Able to transfer safely, definite need of hands   Standing Unsupported with Eyes Closed Able to stand 10 seconds safely   Standing Ubsupported with Feet Together Able to place feet together independently and stand 1 minute safely   From Standing, Reach Forward with Outstretched Arm Can reach forward >12 cm safely (5")   From Standing Position, Pick up Object from Floor Able to pick up shoe, needs supervision   From Standing Position, Turn to Look Behind Over each Shoulder Turn sideways only but maintains balance   Turn 360 Degrees Able to turn 360 degrees safely but slowly   Standing Unsupported, Alternately Place Feet on Step/Stool Able to complete 4 steps without aid or supervision   Standing Unsupported, One Foot in [employer] balance while stepping or standing   Standing on One Leg Tries to lift leg/unable to hold 3 seconds but remains standing independently   Total Score 38   Berg comment: High risk for falls                    [facility] Adult PT Treatment/Exercise - 06/20/16 0001      Neuro Re-ed    Neuro Re-ed Details  standing back to wall and without support to work on trunk and LE extension several trials 30-60 sec x ~ 6-8 trials                 PT Education - 06/20/16 1510    Person(s) Educated Spouse             PT Long Term Goals - 06/20/16 1516      PT LONG TERM GOAL  #1   Title Patient to demonstrate upright posture and alignment with head over body and improved trunk; hip and knee extension 08/01/16   Time 6   Period Weeks   Status New     PT LONG TERM  GOAL #2   Title Increase cervical ROM by 5-8 degrees in all planes allowing patient to maintain improved posture and turn head functionally 08/01/16    Time 6   Period Weeks   Status New     PT LONG TERM GOAL #3   Title Improve Berg Balance Score by 5-8 points to decrease fall risk 08/01/16   Time 6   Period Weeks   Status New     PT LONG TERM GOAL #4   Title Independent in HEP with wife's assistance 08/01/16   Time 6   Period Weeks   Status New     PT LONG TERM GOAL #5   Title Improve FOTO to </= 42% limitation 08/01/16   Time 6   Period Weeks   Status New               Plan - 06/20/16 1510    Clinical Impression Statement Juan Carrillo presents for moderately complex evaluation with diagnosis of cervical pain and paraspinal muscle spasms over the past 4-6 weeks. He has unsteadiness with gait and transfers and Berg Balance Score places him at high risk for falls. Juan Carrillo has poor posture and alignment; end range joint tightness especially through the spine and hip extension; generalized weakness; decreased cervical ROM/mobility; muscular tightness to palpation through the cervical and thoracic musculature; sedentary lifestyle. Patient will benefit form trial of PT to address problems identified.    Rehab Potential Good   PT Frequency 2x / week   PT Duration 6 weeks   PT Treatment/Interventions Patient/family education;ADLs/Self Care Home Management;Neuromuscular re-education;Cryotherapy;Electrical Stimulation;Iontophoresis 4mg /ml Dexamethasone;Moist Heat;Ultrasound;Dry needling;Manual techniques;Therapeutic activities;Therapeutic exercise;Balance training;Functional mobility training;Gait training;Stair training   PT Next Visit Plan progress with postural correction; stretching; strengthening; manual  work and modalities as indicated; neuromuscular re-education; home instruction    Consulted and Agree with Plan of Care Family member/caregiver  wife      Patient will benefit from skilled therapeutic intervention in order to improve the following deficits and impairments:     Visit Diagnosis: Cervicalgia - Plan: PT plan of care cert/re-cert  Other symptoms and signs involving the musculoskeletal system - Plan: PT plan of care cert/re-cert  Unsteadiness on feet - Plan: PT plan of care cert/re-cert  Muscle weakness (generalized) - Plan: PT plan of care cert/re-cert     Problem List Patient Active Problem List   Diagnosis Date Noted  . Cervical paraspinal muscle spasm 05/17/2016  . Memory loss 06/02/2015  . Lumbar degenerative disc disease 05/10/2013    [name] PT, MPH  06/20/2016, 4:06 PM  [facility] [address] [place], [place], [address] Phone: [phone]   Fax:  [phone]  Name: Juan Carrillo MRN: [id number] Date of Birth: 06-19-1924

## 2016-06-27 ENCOUNTER — Encounter: Payer: Self-pay | Admitting: Physical Therapy

## 2016-06-27 ENCOUNTER — Ambulatory Visit (INDEPENDENT_AMBULATORY_CARE_PROVIDER_SITE_OTHER): Payer: Medicare HMO | Admitting: Physical Therapy

## 2016-06-27 DIAGNOSIS — R29898 Other symptoms and signs involving the musculoskeletal system: Secondary | ICD-10-CM

## 2016-06-27 DIAGNOSIS — M542 Cervicalgia: Secondary | ICD-10-CM | POA: Diagnosis not present

## 2016-06-27 DIAGNOSIS — M6281 Muscle weakness (generalized): Secondary | ICD-10-CM

## 2016-06-27 DIAGNOSIS — R2681 Unsteadiness on feet: Secondary | ICD-10-CM | POA: Diagnosis not present

## 2016-06-27 NOTE — Therapy (Signed)
[facility] [address] [place], [place], [address] Phone: [phone]   Fax:  [phone]  Physical Therapy Treatment  Patient Details  Name: Juan Carrillo MRN: [id number] Date of Birth: 1925-03-15 Referring Provider: Dr Lynne Leader   Encounter Date: 06/27/2016      PT End of Session - 06/27/16 1148    Visit Number 2   Number of Visits 12   Date for PT Re-Evaluation 08/01/16   PT Start Time 1148   PT Stop Time 1239   PT Time Calculation (min) 51 min   Activity Tolerance Patient tolerated treatment well      Past Medical History:  Diagnosis Date  . Lumbar degenerative disc disease 05/10/2013  . Memory loss 06/02/2015    History reviewed. No pertinent surgical history.  There were no vitals filed for this visit.      Subjective Assessment - 06/27/16 1148    Subjective Pt's wife said he hasn't been cooperating at home to perform HEP.    Pertinent History LBP; dementia; arthritic changes    Patient Stated Goals to strengthening the neck so he can avoid severe neck pain; help stability in standing and walking    Currently in Pain? Yes   Pain Score 2    Pain Location Neck   Pain Orientation Lower   Pain Descriptors / Indicators Aching;Dull   Pain Type Acute pain   Pain Onset More than a month ago   Pain Frequency Constant   Aggravating Factors  pt reports the pain is almost constant   Pain Relieving Factors heat, massage                         [facility] Adult PT Treatment/Exercise - 06/27/16 0001      Lumbar Exercises: Aerobic   UBE (Upper Arm Bike) standing L2 x 4' alt FWD/BWD, VC's to stand tall      Lumbar Exercises: Supine   Other Supine Lumbar Exercises quad sets each side with faciitation to engage quad, head and shoulder presses with faciliation to perform, bilat shoulder horizontal abduction with physical cues to perform.      Modalities   Modalities Moist Heat     Moist Heat  Therapy   Number Minutes Moist Heat 12 Minutes   Moist Heat Location Cervical  with small towel under head for better alignment     Manual Therapy   Manual Therapy Joint mobilization;Soft tissue mobilization;Manual Traction;Myofascial release   Joint Mobilization gentle grade II cervical mobs in supine   Soft tissue mobilization bilat cervical paraspinals, base of the occiput, bilat upper traps and pecs    Myofascial Release occipital base release, bilat hip distraction in supine   Manual Traction gentle cervical traction                     PT Long Term Goals - 06/27/16 1154      PT LONG TERM GOAL #1   Title Patient to demonstrate upright posture and alignment with head over body and improved trunk; hip and knee extension 08/01/16   Status On-going     PT LONG TERM GOAL #2   Title Increase cervical ROM by 5-8 degrees in all planes allowing patient to maintain improved posture and turn head functionally 08/01/16    Status On-going     PT LONG TERM GOAL #3   Title Improve Berg Balance Score by 5-8 points to decrease fall risk 08/01/16  Status On-going     PT LONG TERM GOAL #4   Title Independent in HEP with wife's assistance 08/01/16   Status On-going     PT LONG TERM GOAL #5   Title Improve FOTO to </= 42% limitation 08/01/16   Status On-going               Plan - 06/27/16 1242    Clinical Impression Statement Juan Carrillo is very tight in the pericervical musles, Rt >Lt.  He has difficulty following directions, requires physical and or verbal cues for all exercise.  No goals met, only his second visit. He is able to lie flat on his back, full knee ext and hips to neutral.Has significant gait deviations.     Rehab Potential Good   PT Frequency 2x / week   PT Duration 6 weeks   PT Treatment/Interventions Patient/family education;ADLs/Self Care Home Management;Neuromuscular re-education;Cryotherapy;Electrical Stimulation;Iontophoresis 36m/ml Dexamethasone;Moist  Heat;Ultrasound;Dry needling;Manual techniques;Therapeutic activities;Therapeutic exercise;Balance training;Functional mobility training;Gait training;Stair training   PT Next Visit Plan cont manual work, pResearch officer, trade union    Consulted and Agree with Plan of Care Patient      Patient will benefit from skilled therapeutic intervention in order to improve the following deficits and impairments:  Postural dysfunction, Decreased strength, Decreased mobility, Pain, Impaired flexibility, Increased muscle spasms, Decreased safety awareness, Decreased range of motion, Difficulty walking  Visit Diagnosis: Cervicalgia  Other symptoms and signs involving the musculoskeletal system  Unsteadiness on feet  Muscle weakness (generalized)     Problem List Patient Active Problem List   Diagnosis Date Noted  . Cervical paraspinal muscle spasm 05/17/2016  . Memory loss 06/02/2015  . Lumbar degenerative disc disease 05/10/2013    SJeral PinchPT 06/27/2016, 12:46 PM  [facility] [place] [address]Phone: [phone]  Fax:  [phone] Name: Juan RosengrenMRN: [id number] of Birth: 1February 08, 1926

## 2016-06-30 ENCOUNTER — Encounter: Payer: Self-pay | Admitting: Rehabilitative and Restorative Service Providers"

## 2016-06-30 ENCOUNTER — Ambulatory Visit (INDEPENDENT_AMBULATORY_CARE_PROVIDER_SITE_OTHER): Payer: Medicare HMO | Admitting: Rehabilitative and Restorative Service Providers"

## 2016-06-30 DIAGNOSIS — M542 Cervicalgia: Secondary | ICD-10-CM

## 2016-06-30 DIAGNOSIS — R29898 Other symptoms and signs involving the musculoskeletal system: Secondary | ICD-10-CM | POA: Diagnosis not present

## 2016-06-30 DIAGNOSIS — R2681 Unsteadiness on feet: Secondary | ICD-10-CM

## 2016-06-30 DIAGNOSIS — M6281 Muscle weakness (generalized): Secondary | ICD-10-CM | POA: Diagnosis not present

## 2016-06-30 NOTE — Patient Instructions (Addendum)
Scapular Retraction: Bilateral    Facing anchor, pull arms back, bringing shoulder blades together. Repeat __10__ times per set. Do _1-3__ sets per session. Do _1___ sessions per day.  Can use swim noodle along spine to improve posture and alignment  Sit up straight to avoid head forward posture

## 2016-06-30 NOTE — Therapy (Signed)
[facility] [place] [address] [place] [address] [place], [place], [address] Phone: [phone]   Fax:  [phone]  Physical Therapy Treatment  Patient Details  Name: Juan Carrillo MRN: [id number] Date of Birth: 07-08-1924 Referring Provider: Dr Clementeen Graham   Encounter Date: 06/30/2016      PT End of Session - 06/30/16 1533    Visit Number 3   Number of Visits 12   Date for PT Re-Evaluation 08/01/16   PT Start Time 1533   PT Stop Time 1620   PT Time Calculation (min) 47 min   Activity Tolerance Patient tolerated treatment well      Past Medical History:  Diagnosis Date  . Lumbar degenerative disc disease 05/10/2013  . Memory loss 06/02/2015    History reviewed. No pertinent surgical history.  There were no vitals filed for this visit.      Subjective Assessment - 06/30/16 1533    Subjective Pt's wife reports that Juan Carrillo misstepped when on the porch Monday after he came home from therapy. He fell and she had to get EMS to come help her get him up. He was not injured. Wife reports that Juan Carrillo hasn't been cooperating at home to perform HEP.    Currently in Pain? No/denies                         [facility] Adult PT Treatment/Exercise - 06/30/16 0001      Neuro Re-ed    Neuro Re-ed Details  standing back to wall and without support to work on trunk and LE extension several trials 30-60 sec x ~ 6-8 trials      Lumbar Exercises: Aerobic   UBE (Upper Arm Bike) standing L2 x 4' alt FWD/BWD, VC's to stand tall      Lumbar Exercises: Standing   Other Standing Lumbar Exercises shoulder blade squeeze at noodle x 10 VC/TC for all reps      Shoulder Exercises: Seated   Row Strengthening;Both;20 reps;Theraband  2 sets - swim noodle along spine in sitting      Moist Heat Therapy   Number Minutes Moist Heat 14 Minutes   Moist Heat Location Cervical     Manual Therapy   Manual therapy comments pt supine    Joint Mobilization  gentle grade II CPA/UPA cervical mobs   Soft tissue mobilization bilat anterior/lateral cervical musculature, cervical paraspinals, base of the occiput, bilat upper traps and pecs    Myofascial Release occipital base release, bilat hip distraction in supine   Manual Traction gentle cervical traction - adding active axial extension with VC/TC of PT 10 sec x 5                 PT Education - 06/30/16 1612    Education provided Yes   Education Details HEP    Person(s) Educated Patient;Spouse   Methods Explanation;Demonstration;Tactile cues;Verbal cues;Handout   Comprehension Verbalized understanding;Returned demonstration;Verbal cues required;Tactile cues required             PT Long Term Goals - 06/27/16 1154      PT LONG TERM GOAL #1   Title Patient to demonstrate upright posture and alignment with head over body and improved trunk; hip and knee extension 08/01/16   Status On-going     PT LONG TERM GOAL #2   Title Increase cervical ROM by 5-8 degrees in all planes allowing patient to maintain improved posture and turn head functionally 08/01/16  Status On-going     PT LONG TERM GOAL #3   Title Improve Berg Balance Score by 5-8 points to decrease fall risk 08/01/16   Status On-going     PT LONG TERM GOAL #4   Title Independent in HEP with wife's assistance 08/01/16   Status On-going     PT LONG TERM GOAL #5   Title Improve FOTO to </= 42% limitation 08/01/16   Status On-going               Plan - 06/30/16 1612    Clinical Impression Statement Ms Weant reports that patient fell Monday but did not sustain any injuries. He has not done his exercise at home and does not have ability to remember or perform exercises per his wife - and will not do the exercises for her. Discussed that Juan Carrillo needs to do exercises more than just in PT twice a week to realize any improvement. Provided new TB exercise that can be done in sitting to try.    Rehab Potential Good   PT  Frequency 2x / week   PT Duration 6 weeks   PT Treatment/Interventions Patient/family education;ADLs/Self Care Home Management;Neuromuscular re-education;Cryotherapy;Electrical Stimulation;Iontophoresis /ml Dexamethasone;Moist Heat;Ultrasound;Dry needling;Manual techniques;Therapeutic activities;Therapeutic exercise;Balance training;Functional mobility training;Gait training;Stair training   PT Next Visit Plan cont manual work, postural correction.    Consulted and Agree with Plan of Care Patient      Patient will benefit from skilled therapeutic intervention in order to improve the following deficits and impairments:  Postural dysfunction, Decreased strength, Decreased mobility, Pain, Impaired flexibility, Increased muscle spasms, Decreased safety awareness, Decreased range of motion, Difficulty walking  Visit Diagnosis: Cervicalgia  Other symptoms and signs involving the musculoskeletal system  Unsteadiness on feet  Muscle weakness (generalized)     Problem List Patient Active Problem List   Diagnosis Date Noted  . Cervical paraspinal muscle spasm 05/17/2016  . Memory loss 06/02/2015  . Lumbar degenerative disc disease 05/10/2013    [name] Rober Minion PT, MPH  06/30/2016, 4:16 PM  [facility] [address] [place] [address] [place], [place], [address] Phone: [phone]   Fax:  [phone]  Name: Juan Carrillo MRN: [id number] Date of Birth: 03/22/1925

## 2016-07-04 ENCOUNTER — Ambulatory Visit (INDEPENDENT_AMBULATORY_CARE_PROVIDER_SITE_OTHER): Payer: Medicare HMO | Admitting: Rehabilitative and Restorative Service Providers"

## 2016-07-04 ENCOUNTER — Encounter: Payer: Self-pay | Admitting: Rehabilitative and Restorative Service Providers"

## 2016-07-04 DIAGNOSIS — M542 Cervicalgia: Secondary | ICD-10-CM

## 2016-07-04 DIAGNOSIS — R29898 Other symptoms and signs involving the musculoskeletal system: Secondary | ICD-10-CM

## 2016-07-04 DIAGNOSIS — R2681 Unsteadiness on feet: Secondary | ICD-10-CM

## 2016-07-04 DIAGNOSIS — M6281 Muscle weakness (generalized): Secondary | ICD-10-CM

## 2016-07-04 NOTE — Therapy (Signed)
[facility] [place] [address] [place] [address] [place], [place], [address] Phone: [phone]   Fax:  [phone]  Physical Therapy Treatment  Patient Details  Name: Juan Carrillo MRN: [id number] Date of Birth: 1925-03-08 Referring Provider: Dr Clementeen Graham   Encounter Date: 07/04/2016      PT End of Session - 07/04/16 1431    Visit Number 4   Number of Visits 12   Date for PT Re-Evaluation 08/01/16   PT Start Time 1432   PT Stop Time 1525   PT Time Calculation (min) 53 min   Activity Tolerance Patient tolerated treatment well      Past Medical History:  Diagnosis Date  . Lumbar degenerative disc disease 05/10/2013  . Memory loss 06/02/2015    History reviewed. No pertinent surgical history.  There were no vitals filed for this visit.      Subjective Assessment - 07/04/16 1432    Subjective No more falls. Wife is able to help patient with exercises at home with the theraband - hooked TB around feet while he was sitting in the recliner. Patient unable to comprehend and remember exercises for HEP    Currently in Pain? Yes   Pain Score --  unable to rate pain on pain scale - due to mental status            [facility] PT Assessment - 07/04/16 0001      Assessment   Medical Diagnosis paraspinal cervical muscle spasm; unsteadiness    Referring Provider Dr Clementeen Graham    Onset Date/Surgical Date 05/12/16   Hand Dominance Right   Next MD Visit PRN    Prior Therapy yes - 2 appts at outside facility      AROM   Cervical Flexion 39   Cervical Extension 20   Cervical - Right Side Bend 18   Cervical - Left Side Bend 13   Cervical - Right Rotation 52   Cervical - Left Rotation 53     Palpation   Palpation comment muscular tightness bilat ant/lat/posterior cerivcal musculature; upper traps; leveator; pecs; mid thoracic paraspinals bilat - Rt > lt                      [facility] Adult PT Treatment/Exercise - 07/04/16 0001      Lumbar Exercises: Aerobic   UBE (Upper Arm Bike) standing L2 x 4' alt FWD/BWD, VC's to stand tall      Lumbar Exercises: Standing   Other Standing Lumbar Exercises standing at wall - extending trunk, hips and knees pushing hips away from the wall hold 3-5 sec and repeat x 10; back at wall shallow knee bends x 20 verbal and tactile cues for extension; standing at counter - hip extension/hip abduction x 10 each LE; at counter marching x 20 each LE; standing at counter - bilat shoudler elevation sliding hands up cabinet x 10 2 sets; standing w/ (+)1 contact guard assist kicking large orange ball shifting weight to work on balance x ~20-25 each LE      Lumbar Exercises: Seated   Sit to Stand Limitations sit to stand x 5 from standard chair-  no UE support      Shoulder Exercises: Seated   Row Strengthening;Both;20 reps;Theraband  2 sets -   Horizontal ABduction Strengthening;Both;20 reps;Theraband   Theraband Level (Shoulder Horizontal ABduction) Level 1 (Yellow)   External Rotation Strengthening;Right;Left;20 reps;Theraband  2 sets - assist to secure TB    Theraband Level (  Shoulder External Rotation) Level 1 (Yellow)   Flexion Limitations sitting rolling large red ball forward with bilat shoudler elevation x 20    Other Seated Exercises toss and catch w/large orange ball - difficulty catching ball - able to touch and release the ball       Moist Heat Therapy   Number Minutes Moist Heat 14 Minutes   Moist Heat Location Cervical                     PT Long Term Goals - 07/04/16 1433      PT LONG TERM GOAL #1   Title Patient to demonstrate upright posture and alignment with head over body and improved trunk; hip and knee extension 08/01/16   Time 6   Period Weeks   Status On-going     PT LONG TERM GOAL #2   Title Increase cervical ROM by 5-8 degrees in all planes allowing patient to maintain improved posture and turn head functionally 08/01/16    Time 6   Period Weeks    Status On-going     PT LONG TERM GOAL #3   Title Improve Berg Balance Score by 5-8 points to decrease fall risk 08/01/16   Time 6   Period Weeks   Status On-going     PT LONG TERM GOAL #4   Title Independent in HEP with wife's assistance 08/01/16   Time 6   Period Weeks   Status On-going     PT LONG TERM GOAL #5   Title Improve FOTO to </= 42% limitation 08/01/16   Time 6   Period Weeks   Status On-going               Plan - 07/04/16 1437    Clinical Impression Statement Charile was able to do some exercises with the theraband at home - hooked the band around his feet while in the recliner - which is a great idea. No falls since he was here last. Added exercises in sitting and standing to work on generalized strength, coordination and balance. Patient tolerates exercise in the clinic with some rest breaks. No goals of therpay accomplished.    Rehab Potential Good   PT Frequency 2x / week   PT Duration 6 weeks   PT Treatment/Interventions Patient/family education;ADLs/Self Care Home Management;Neuromuscular re-education;Cryotherapy;Electrical Stimulation;Iontophoresis /ml Dexamethasone;Moist Heat;Ultrasound;Dry needling;Manual techniques;Therapeutic activities;Therapeutic exercise;Balance training;Functional mobility training;Gait training;Stair training   PT Next Visit Plan cont manual work, postural correction.       Patient will benefit from skilled therapeutic intervention in order to improve the following deficits and impairments:  Postural dysfunction, Decreased strength, Decreased mobility, Pain, Impaired flexibility, Increased muscle spasms, Decreased safety awareness, Decreased range of motion, Difficulty walking  Visit Diagnosis: Cervicalgia  Other symptoms and signs involving the musculoskeletal system  Unsteadiness on feet  Muscle weakness (generalized)     Problem List Patient Active Problem List   Diagnosis Date Noted  . Cervical paraspinal muscle  spasm 05/17/2016  . Memory loss 06/02/2015  . Lumbar degenerative disc disease 05/10/2013    [name] PT, MPH  07/04/2016, 3:33 PM  [facility] [address] [place] [address] [place], [place], [address] Phone: [phone]   Fax:  [phone]  Name: Basilio Meadow MRN: [id number] Date of Birth: Feb 07, 1925

## 2016-07-07 ENCOUNTER — Ambulatory Visit (INDEPENDENT_AMBULATORY_CARE_PROVIDER_SITE_OTHER): Payer: Medicare HMO | Admitting: Rehabilitative and Restorative Service Providers"

## 2016-07-07 DIAGNOSIS — M542 Cervicalgia: Secondary | ICD-10-CM | POA: Diagnosis not present

## 2016-07-07 DIAGNOSIS — R2681 Unsteadiness on feet: Secondary | ICD-10-CM

## 2016-07-07 DIAGNOSIS — R29898 Other symptoms and signs involving the musculoskeletal system: Secondary | ICD-10-CM

## 2016-07-07 DIAGNOSIS — M6281 Muscle weakness (generalized): Secondary | ICD-10-CM

## 2016-07-07 NOTE — Therapy (Signed)
[facility] [place] [address] [place] [address] [place], [place], [address] Phone: [phone]   Fax:  [phone]  Physical Therapy Treatment  Patient Details  Name: Juan Carrillo MRN: [id number] Date of Birth: 02-08-1925 Referring Provider: Dr Clementeen Graham   Encounter Date: 07/07/2016      PT End of Session - 07/07/16 1456    Visit Number 5   Number of Visits 12   Date for PT Re-Evaluation 08/01/16   PT Start Time 1446   PT Stop Time 1533   PT Time Calculation (min) 47 min   Activity Tolerance Patient tolerated treatment well      Past Medical History:  Diagnosis Date  . Lumbar degenerative disc disease 05/10/2013  . Memory loss 06/02/2015    No past surgical history on file.  There were no vitals filed for this visit.      Subjective Assessment - 07/07/16 1458    Subjective Wife reports that [name] seems to be standing up straighter at home.    Currently in Pain? No/denies                         [facility] Adult PT Treatment/Exercise - 07/07/16 0001      Neuro Re-ed    Neuro Re-ed Details  standing encouraging extensioin of trunk; hips; knees - reaching overhead with extension; taping PT's hands overhead      Lumbar Exercises: Aerobic   UBE (Upper Arm Bike) standing L2 x 5' alt FWD/BWD, VC's to stand tall      Lumbar Exercises: Standing   Heel Raises 20 reps  UE support at counter    Other Standing Lumbar Exercises marching x 20 each LE UE support at counter; standing posterior hips at counter pushing hips away from counter to encourage trunk and LE extension 2 sets of 10    Other Standing Lumbar Exercises standing at wall - extending trunk, hips and knees pushing hips away from the wall hold 3-5 sec and repeat x 10; back at wall shallow knee bends x 20 verbal and tactile cues for extension; standing at counter - hip extension/hip abduction x 10 each LE; at counter marching x 20 each LE; standing at counter - bilat  shoudler elevation sliding hands up cabinet x 10 2 sets; standing w/ (+)1 contact guard assist kicking large orange ball shifting weight to work on balance x ~20-25 each LE      Lumbar Exercises: Seated   Sit to Stand Limitations sit to stand x 10 from standard chair-  with UE support to no UE support with arms forward, hands resting on PT's hands encouraging forward wt shift with sit to stand      Shoulder Exercises: Seated   Row Strengthening;Both;20 reps;Theraband  2 sets -   Theraband Level (Shoulder Row) Level 2 (Red)   External Rotation Strengthening;Right;Left;20 reps;Theraband  2 sets - assist to secure TB    Theraband Level (Shoulder External Rotation) Level 2 (Red)   Flexion Limitations sitting rolling large red ball forward with bilat shoudler elevation x 20    Other Seated Exercises toss and catch w/large orange ball - difficulty catching ball - able to touch and release the ball; ball at side of chair pressing down x 20 each side    Other Seated Exercises diagonals with red TB - 10 x 2 sets      Moist Heat Therapy   Number Minutes Moist Heat --  declined heat stating  neck did not hurt today                     PT Long Term Goals - 07/04/16 1433      PT LONG TERM GOAL #1   Title Patient to demonstrate upright posture and alignment with head over body and improved trunk; hip and knee extension 08/01/16   Time 6   Period Weeks   Status On-going     PT LONG TERM GOAL #2   Title Increase cervical ROM by 5-8 degrees in all planes allowing patient to maintain improved posture and turn head functionally 08/01/16    Time 6   Period Weeks   Status On-going     PT LONG TERM GOAL #3   Title Improve Berg Balance Score by 5-8 points to decrease fall risk 08/01/16   Time 6   Period Weeks   Status On-going     PT LONG TERM GOAL #4   Title Independent in HEP with wife's assistance 08/01/16   Time 6   Period Weeks   Status On-going     PT LONG TERM GOAL #5   Title  Improve FOTO to </= 42% limitation 08/01/16   Time 6   Period Weeks   Status On-going               Plan - 07/07/16 1457    Clinical Impression Statement Chalie demonstrated improved hip and knee extension upon entering the clinic today and with exercises. He is tolerating increased exercise with less periods for rest. No report of neck pain today. Progressing gradually with rehab.    Rehab Potential Good   PT Frequency 2x / week   PT Duration 6 weeks   PT Treatment/Interventions Patient/family education;ADLs/Self Care Home Management;Neuromuscular re-education;Cryotherapy;Electrical Stimulation;Iontophoresis /ml Dexamethasone;Moist Heat;Ultrasound;Dry needling;Manual techniques;Therapeutic activities;Therapeutic exercise;Balance training;Functional mobility training;Gait training;Stair training   PT Next Visit Plan cont manual work, postural correction.    Consulted and Agree with Plan of Care Patient      Patient will benefit from skilled therapeutic intervention in order to improve the following deficits and impairments:  Postural dysfunction, Decreased strength, Decreased mobility, Pain, Impaired flexibility, Increased muscle spasms, Decreased safety awareness, Decreased range of motion, Difficulty walking  Visit Diagnosis: Cervicalgia  Other symptoms and signs involving the musculoskeletal system  Unsteadiness on feet  Muscle weakness (generalized)     Problem List Patient Active Problem List   Diagnosis Date Noted  . Cervical paraspinal muscle spasm 05/17/2016  . Memory loss 06/02/2015  . Lumbar degenerative disc disease 05/10/2013    [name] PT, MPH  07/07/2016, 3:52 PM  [facility] [address] [place] [address] [place], [place], [address] Phone: [phone]   Fax:  [phone]  Name: Juan Carrillo MRN: [id number] Date of Birth: [birth year]

## 2016-07-11 ENCOUNTER — Encounter: Payer: Self-pay | Admitting: Rehabilitative and Restorative Service Providers"

## 2016-07-11 ENCOUNTER — Ambulatory Visit (INDEPENDENT_AMBULATORY_CARE_PROVIDER_SITE_OTHER): Payer: Medicare HMO | Admitting: Rehabilitative and Restorative Service Providers"

## 2016-07-11 DIAGNOSIS — R29898 Other symptoms and signs involving the musculoskeletal system: Secondary | ICD-10-CM

## 2016-07-11 DIAGNOSIS — M542 Cervicalgia: Secondary | ICD-10-CM

## 2016-07-11 DIAGNOSIS — M6281 Muscle weakness (generalized): Secondary | ICD-10-CM | POA: Diagnosis not present

## 2016-07-11 DIAGNOSIS — R2681 Unsteadiness on feet: Secondary | ICD-10-CM | POA: Diagnosis not present

## 2016-07-11 NOTE — Therapy (Signed)
[facility] [place] [address] [place] [address] [place], [place], [address] Phone: [phone]   Fax:  [phone]  Physical Therapy Treatment  Patient Details  Name: Juan Carrillo MRN: [id number] Date of Birth: 1924/06/04 Referring Provider: Dr Clementeen Graham   Encounter Date: 07/11/2016      PT End of Session - 07/11/16 1434    Visit Number 6   Number of Visits 12   Date for PT Re-Evaluation 08/01/16   PT Start Time 1434   PT Stop Time 1519   PT Time Calculation (min) 45 min   Activity Tolerance Patient tolerated treatment well      Past Medical History:  Diagnosis Date  . Lumbar degenerative disc disease 05/10/2013  . Memory loss 06/02/2015    History reviewed. No pertinent surgical history.  There were no vitals filed for this visit.      Subjective Assessment - 07/11/16 1435    Subjective No pain in the neck.    Currently in Pain? No/denies            [facility] PT Assessment - 07/11/16 0001      Assessment   Medical Diagnosis paraspinal cervical muscle spasm; unsteadiness    Referring Provider Dr Clementeen Graham    Onset Date/Surgical Date 05/12/16   Hand Dominance Right   Next MD Visit PRN      Palpation   Palpation comment improved muscular tightness bilat ant/lat/posterior cerivcal musculature; upper traps; leveator; pecs; mid thoracic paraspinals bilat - Rt > lt      Berg Balance Test   Sit to Stand Able to stand  independently using hands   Standing Unsupported Able to stand safely 2 minutes   Sitting with Back Unsupported but Feet Supported on Floor or Stool Able to sit safely and securely 2 minutes   Stand to Sit Sits safely with minimal use of hands   Transfers Able to transfer safely, definite need of hands   Standing Unsupported with Eyes Closed Able to stand 10 seconds safely   Standing Ubsupported with Feet Together Able to place feet together independently and stand 1 minute safely   From Standing, Reach Forward  with Outstretched Arm Can reach confidently >25 cm (10")   From Standing Position, Pick up Object from Floor Able to pick up shoe safely and easily   From Standing Position, Turn to Look Behind Over each Shoulder Looks behind from both sides and weight shifts well   Turn 360 Degrees Able to turn 360 degrees safely but slowly   Standing Unsupported, Alternately Place Feet on Step/Stool Able to complete >2 steps/needs minimal assist   Standing Unsupported, One Foot in [employer] balance while stepping or standing   Standing on One Leg Tries to lift leg/unable to hold 3 seconds but remains standing independently   Total Score 42   Berg comment: High risk for falls                      [facility] Adult PT Treatment/Exercise - 07/11/16 0001      Lumbar Exercises: Aerobic   UBE (Upper Arm Bike) standing L3 x 5' alt FWD/BWD, VC's to stand tall      Lumbar Exercises: Standing   Heel Raises 20 reps  UE support at stairs   Functional Squats 20 reps  UE support at stairs    Other Standing Lumbar Exercises marching x 20 each LE UE support at counter; standing posterior hips at counter pushing hips  away from counter to encourage trunk and LE extension 2 sets of 10; SLS 10-20 sec x 5 each LE; step up leading with each LE x 10 on 6 inch step      Lumbar Exercises: Seated   Sit to Stand Limitations sit to stand x 10 from standard chair-  with UE support to no UE support with arms forward, hands resting on PT's hands encouraging forward wt shift with sit to stand      Shoulder Exercises: Seated   Row Strengthening;Both;20 reps;Theraband  2 sets -   Theraband Level (Shoulder Row) Level 4 (Blue)   Flexion --  reaching up w/ sm orange ball multiple reps w/ PT                      PT Long Term Goals - 07/04/16 1433      PT LONG TERM GOAL #1   Title Patient to demonstrate upright posture and alignment with head over body and improved trunk; hip and knee extension 08/01/16   Time  6   Period Weeks   Status On-going     PT LONG TERM GOAL #2   Title Increase cervical ROM by 5-8 degrees in all planes allowing patient to maintain improved posture and turn head functionally 08/01/16    Time 6   Period Weeks   Status On-going     PT LONG TERM GOAL #3   Title Improve Berg Balance Score by 5-8 points to decrease fall risk 08/01/16   Time 6   Period Weeks   Status On-going     PT LONG TERM GOAL #4   Title Independent in HEP with wife's assistance 08/01/16   Time 6   Period Weeks   Status On-going     PT LONG TERM GOAL #5   Title Improve FOTO to </= 42% limitation 08/01/16   Time 6   Period Weeks   Status On-going               Plan - 07/11/16 1439    Clinical Impression Statement [name] continues to work well in the clinic. He tolerates increased exercise and activities. Gait continues to be forward flexed. [name] can achieve improved trunk, hip and knee extension with verbal cues. Demonstrates 4 point improvement on Berg Balance Test    PT Frequency 2x / week   PT Duration 6 weeks   PT Treatment/Interventions Patient/family education;ADLs/Self Care Home Management;Neuromuscular re-education;Cryotherapy;Electrical Stimulation;Iontophoresis /ml Dexamethasone;Moist Heat;Ultrasound;Dry needling;Manual techniques;Therapeutic activities;Therapeutic exercise;Balance training;Functional mobility training;Gait training;Stair training   PT Next Visit Plan cont, postural correction; strengthening; balance and gait activities    Consulted and Agree with Plan of Care Patient      Patient will benefit from skilled therapeutic intervention in order to improve the following deficits and impairments:  Postural dysfunction, Decreased strength, Decreased mobility, Pain, Impaired flexibility, Increased muscle spasms, Decreased safety awareness, Decreased range of motion, Difficulty walking  Visit Diagnosis: Cervicalgia  Other symptoms and signs involving the  musculoskeletal system  Unsteadiness on feet  Muscle weakness (generalized)     Problem List Patient Active Problem List   Diagnosis Date Noted  . Cervical paraspinal muscle spasm 05/17/2016  . Memory loss 06/02/2015  . Lumbar degenerative disc disease 05/10/2013    Neytiri Asche Rober Minion  PT, MPH  07/11/2016, 3:25 PM  [facility] [address] [place] [address] [place], [place], [address] Phone: [phone]   Fax:  [phone]  Name: Colbey Wirtanen MRN:  [id number] Date of Birth: 07/06/1924

## 2016-07-14 ENCOUNTER — Ambulatory Visit (INDEPENDENT_AMBULATORY_CARE_PROVIDER_SITE_OTHER): Payer: Medicare HMO | Admitting: Rehabilitative and Restorative Service Providers"

## 2016-07-14 ENCOUNTER — Encounter: Payer: Self-pay | Admitting: Rehabilitative and Restorative Service Providers"

## 2016-07-14 DIAGNOSIS — M6281 Muscle weakness (generalized): Secondary | ICD-10-CM

## 2016-07-14 DIAGNOSIS — R2681 Unsteadiness on feet: Secondary | ICD-10-CM | POA: Diagnosis not present

## 2016-07-14 DIAGNOSIS — R29898 Other symptoms and signs involving the musculoskeletal system: Secondary | ICD-10-CM | POA: Diagnosis not present

## 2016-07-14 DIAGNOSIS — M542 Cervicalgia: Secondary | ICD-10-CM | POA: Diagnosis not present

## 2016-07-14 NOTE — Therapy (Signed)
[facility] [place] [address] [place] [address] [place], [place], [address] Phone: [phone]   Fax:  [phone]  Physical Therapy Treatment  Patient Details  Name: Juan Carrillo MRN: [id number] Date of Birth: [birth year] Referring Provider: Dr Clementeen Graham   Encounter Date: 07/14/2016      PT End of Session - 07/14/16 1457    Visit Number 7   Number of Visits 12   Date for PT Re-Evaluation 08/01/16   PT Start Time 1447   PT Stop Time 1534   PT Time Calculation (min) 47 min      Past Medical History:  Diagnosis Date  . Lumbar degenerative disc disease 05/10/2013  . Memory loss 06/02/2015    History reviewed. No pertinent surgical history.  There were no vitals filed for this visit.                       [facility] Adult PT Treatment/Exercise - 07/14/16 0001      Neuro Re-ed    Neuro Re-ed Details  standing encouraging extensioin of trunk; hips; knees - reaching overhead with extension; taping PT's hands overhead      Lumbar Exercises: Stretches   Passive Hamstring Stretch 3 reps;30 seconds  supine w/ strap w/ PT assist      Lumbar Exercises: Standing   Heel Raises 20 reps  UE support at stairs   Functional Squats 20 reps  UE support at stairs    Functional Squats Limitations step ups x 10 each LE 6 inch step; alt touching toe to 12 inch stair x 10 each LE; SLS 10 sec x 5 reps each LE 2 HH at railing      Lumbar Exercises: Seated   Sit to Stand Limitations sit to stand x 10 from standard chair-  with UE support to no UE support with arms forward, hands resting on PT's hands encouraging forward wt shift with sit to stand      Lumbar Exercises: Supine   Bridge 10 reps;5 seconds  verbal cues by PT   Straight Leg Raise 10 reps;5 seconds  PT verbal and tactile cues required     Shoulder Exercises: Seated   Row Strengthening;Both;20 reps;Theraband  2 sets -   Theraband Level (Shoulder Row) Level 4 (Blue)   Horizontal ABduction Strengthening;Both;20 reps   Theraband Level (Shoulder Horizontal ABduction) Level 3 (Green)                     PT Long Term Goals - 07/14/16 1456      PT LONG TERM GOAL #1   Title Patient to demonstrate upright posture and alignment with head over body and improved trunk; hip and knee extension 08/01/16   Time 6   Period Weeks   Status On-going     PT LONG TERM GOAL #2   Title Increase cervical ROM by 5-8 degrees in all planes allowing patient to maintain improved posture and turn head functionally 08/01/16    Time 6   Period Weeks   Status On-going     PT LONG TERM GOAL #3   Title Improve Berg Balance Score by 5-8 points to decrease fall risk 08/01/16   Period Weeks   Status On-going     PT LONG TERM GOAL #4   Title Independent in HEP with wife's assistance 08/01/16   Time 6   Period Weeks   Status On-going     PT LONG TERM GOAL #5  Title Improve FOTO to </= 42% limitation 08/01/16   Time 6   Period Weeks   Status On-going               Plan - 07/14/16 1458    Clinical Impression Statement Gradual improvement in functional strength and endurance per wife's report. [name] tolerates clinic exercise well and participates with all activities as directed by therapist.    Rehab Potential Good   PT Frequency 2x / week   PT Duration 6 weeks   PT Treatment/Interventions Patient/family education;ADLs/Self Care Home Management;Neuromuscular re-education;Cryotherapy;Electrical Stimulation;Iontophoresis /ml Dexamethasone;Moist Heat;Ultrasound;Dry needling;Manual techniques;Therapeutic activities;Therapeutic exercise;Balance training;Functional mobility training;Gait training;Stair training   PT Next Visit Plan cont, postural correction; strengthening; balance and gait activities    Consulted and Agree with Plan of Care Patient      Patient will benefit from skilled therapeutic intervention in order to improve the following deficits and  impairments:  Postural dysfunction, Decreased strength, Decreased mobility, Pain, Impaired flexibility, Increased muscle spasms, Decreased safety awareness, Decreased range of motion, Difficulty walking  Visit Diagnosis: Cervicalgia  Other symptoms and signs involving the musculoskeletal system  Unsteadiness on feet  Muscle weakness (generalized)     Problem List Patient Active Problem List   Diagnosis Date Noted  . Cervical paraspinal muscle spasm 05/17/2016  . Memory loss 06/02/2015  . Lumbar degenerative disc disease 05/10/2013    Jayvian Escoe Rober Minion PT, MPH  07/14/2016, 3:40 PM  [facility] [address] [place] [address] [place], [place], [address] Phone: [phone]   Fax:  [phone]  Name: Ruslan Mccabe MRN: [id number] Date of Birth: 1924/11/23

## 2016-07-21 ENCOUNTER — Encounter: Payer: Medicare HMO | Admitting: Physical Therapy

## 2016-07-25 ENCOUNTER — Ambulatory Visit (INDEPENDENT_AMBULATORY_CARE_PROVIDER_SITE_OTHER): Payer: Medicare HMO | Admitting: Rehabilitative and Restorative Service Providers"

## 2016-07-25 DIAGNOSIS — R2681 Unsteadiness on feet: Secondary | ICD-10-CM | POA: Diagnosis not present

## 2016-07-25 DIAGNOSIS — R29898 Other symptoms and signs involving the musculoskeletal system: Secondary | ICD-10-CM | POA: Diagnosis not present

## 2016-07-25 DIAGNOSIS — M6281 Muscle weakness (generalized): Secondary | ICD-10-CM | POA: Diagnosis not present

## 2016-07-25 DIAGNOSIS — M542 Cervicalgia: Secondary | ICD-10-CM

## 2016-07-25 NOTE — Therapy (Signed)
[facility] [place] [address] [place] [address] [place], [place], [address] Phone: [phone]   Fax:  [phone]  Physical Therapy Treatment  Patient Details  Name: Juan Carrillo MRN: [id number] Date of Birth: 12/25/1924 Referring Provider: Dr Clementeen Graham   Encounter Date: 07/25/2016      PT End of Session - 07/25/16 1437    Visit Number 8   Number of Visits 12   Date for PT Re-Evaluation 08/01/16   PT Start Time 1438  7 min late for appt    PT Stop Time 1517   PT Time Calculation (min) 39 min   Activity Tolerance Patient tolerated treatment well      Past Medical History:  Diagnosis Date  . Lumbar degenerative disc disease 05/10/2013  . Memory loss 06/02/2015    No past surgical history on file.  There were no vitals filed for this visit.      Subjective Assessment - 07/25/16 1456    Subjective No pain. Juan Carrillo reports that Juan Carrillo is getting up fro his chair with greater ease.    Currently in Pain? No/denies                         [facility] Adult PT Treatment/Exercise - 07/25/16 0001      Lumbar Exercises: Standing   Other Standing Lumbar Exercises marching x 20 each LE UE support at counter; standing posterior hips at counter pushing hips away from counter to encourage trunk and LE extension 2 sets of 10; SLS 10-20 sec x 5 each LE; step up leading with each LE x 10 on 6 inch step      Lumbar Exercises: Seated   Sit to Stand Limitations sit to stand x 10 from standard chair-  with UE support to no UE support with arms forward, hands resting on PT's hands encouraging forward wt shift with sit to stand; body blade bilat hands for elbow flexion/extension x 10-20 sec x 5      Shoulder Exercises: Seated   Extension Strengthening;Both;20 reps;Theraband   Theraband Level (Shoulder Extension) Level 4 (Blue)   Row Strengthening;Both;20 reps;Theraband  2 sets -   Theraband Level (Shoulder Row) Level 4 (Blue)   Horizontal ABduction Strengthening;Both;5 reps   Theraband Level (Shoulder Horizontal ABduction) Level 3 (Green)  fatigued                     PT Long Term Goals - 07/25/16 1438      PT LONG TERM GOAL #1   Title Patient to demonstrate upright posture and alignment with head over body and improved trunk; hip and knee extension 08/01/16   Time 6   Period Weeks   Status On-going     PT LONG TERM GOAL #2   Title Increase cervical ROM by 5-8 degrees in all planes allowing patient to maintain improved posture and turn head functionally 08/01/16    Time 6   Period Weeks   Status On-going     PT LONG TERM GOAL #3   Title Improve Berg Balance Score by 5-8 points to decrease fall risk 08/01/16   Time 6   Period Weeks   Status On-going     PT LONG TERM GOAL #4   Title Independent in HEP with wife's assistance 08/01/16   Time 6   Period Weeks   Status On-going     PT LONG TERM GOAL #5   Title Improve FOTO to </=  42% limitation 08/01/16   Time 6   Period Weeks   Status On-going               Plan - 07/25/16 1444    Clinical Impression Statement Continues to work well in therapy with verbal and tactile cues. Note increased exercise endurance and increased resistance. Gradually progressing with rehab goals.    Rehab Potential Good   PT Frequency 2x / week   PT Duration 6 weeks   PT Treatment/Interventions Patient/family education;ADLs/Self Care Home Management;Neuromuscular re-education;Cryotherapy;Electrical Stimulation;Iontophoresis /ml Dexamethasone;Moist Heat;Ultrasound;Dry needling;Manual techniques;Therapeutic activities;Therapeutic exercise;Balance training;Functional mobility training;Gait training;Stair training   PT Next Visit Plan cont, postural correction; strengthening; balance and gait activities  - reassess as time allows next visit    Consulted and Agree with Plan of Care Patient      Patient will benefit from skilled therapeutic intervention in order  to improve the following deficits and impairments:  Postural dysfunction, Decreased strength, Decreased mobility, Pain, Impaired flexibility, Increased muscle spasms, Decreased safety awareness, Decreased range of motion, Difficulty walking  Visit Diagnosis: Cervicalgia  Other symptoms and signs involving the musculoskeletal system  Unsteadiness on feet  Muscle weakness (generalized)     Problem List Patient Active Problem List   Diagnosis Date Noted  . Cervical paraspinal muscle spasm 05/17/2016  . Memory loss 06/02/2015  . Lumbar degenerative disc disease 05/10/2013    Juan Carrillo Rober Minion PT,MPH  07/25/2016, 3:23 PM  [facility] [address] [place] [address] [place], [place], [address] Phone: [phone]   Fax:  [phone]  Name: Juan Carrillo MRN: [id number] Date of Birth: 1924-06-24

## 2016-07-28 ENCOUNTER — Ambulatory Visit (INDEPENDENT_AMBULATORY_CARE_PROVIDER_SITE_OTHER): Payer: Medicare HMO | Admitting: Rehabilitative and Restorative Service Providers"

## 2016-07-28 ENCOUNTER — Encounter: Payer: Self-pay | Admitting: Rehabilitative and Restorative Service Providers"

## 2016-07-28 DIAGNOSIS — M6281 Muscle weakness (generalized): Secondary | ICD-10-CM

## 2016-07-28 DIAGNOSIS — R2681 Unsteadiness on feet: Secondary | ICD-10-CM | POA: Diagnosis not present

## 2016-07-28 DIAGNOSIS — R29898 Other symptoms and signs involving the musculoskeletal system: Secondary | ICD-10-CM | POA: Diagnosis not present

## 2016-07-28 DIAGNOSIS — M542 Cervicalgia: Secondary | ICD-10-CM

## 2016-07-28 NOTE — Therapy (Signed)
[facility] [place] [address] [place] [address] [place], [place], [address] Phone: [phone]   Fax:  [phone]  Physical Therapy Treatment  Patient Details  Name: Cavon Nicolls MRN: [id number] Date of Birth: 1924-09-16 Referring Provider: Dr Clementeen Graham   Encounter Date: 07/28/2016      PT End of Session - 07/28/16 1447    Visit Number 9   Number of Visits 12   Date for PT Re-Evaluation 08/01/16   PT Start Time 1446   PT Stop Time 1531   PT Time Calculation (min) 45 min   Activity Tolerance Patient tolerated treatment well      Past Medical History:  Diagnosis Date  . Lumbar degenerative disc disease 05/10/2013  . Memory loss 06/02/2015    History reviewed. No pertinent surgical history.  There were no vitals filed for this visit.      Subjective Assessment - 07/28/16 1458    Subjective No pain. Ms. Kirker reports that [name] is moving with greater ease to change positions.    Currently in Pain? No/denies                         [facility] Adult PT Treatment/Exercise - 07/28/16 0001      Lumbar Exercises: Standing   Heel Raises 20 reps  UE support at stair rails    Functional Squats 20 reps  unsupported - VC for posture    Functional Squats Limitations step ups x 30 each LE 6 inch step; alt touching toe to 12 inch stair x 10 each LE x 2 sets; SLS 10 sec x 5 reps each LE 2 HH at railing      Lumbar Exercises: Seated   Sit to Stand Limitations sit to stand x 10 from standard chair-  with UE support to no UE support with arms forward, hands resting on PT's hands encouraging forward wt shift with sit to stand; body blade bilat hands for elbow flexion/extension x 20-30 sec x 5      Shoulder Exercises: Seated   Extension Strengthening;Both;20 reps;Theraband   Theraband Level (Shoulder Extension) Level 4 (Blue)   Row Strengthening;Both;20 reps;Theraband  2 sets -   Theraband Level (Shoulder Row) Level 4 (Blue)   Horizontal ABduction Strengthening;Both;5 reps   Theraband Level (Shoulder Horizontal ABduction) Level 2 (Red)   Other Seated Exercises overhead press x 10 x 2 sets w/ 2 # each UE                     PT Long Term Goals - 07/25/16 1438      PT LONG TERM GOAL #1   Title Patient to demonstrate upright posture and alignment with head over body and improved trunk; hip and knee extension 08/01/16   Time 6   Period Weeks   Status On-going     PT LONG TERM GOAL #2   Title Increase cervical ROM by 5-8 degrees in all planes allowing patient to maintain improved posture and turn head functionally 08/01/16    Time 6   Period Weeks   Status On-going     PT LONG TERM GOAL #3   Title Improve Berg Balance Score by 5-8 points to decrease fall risk 08/01/16   Time 6   Period Weeks   Status On-going     PT LONG TERM GOAL #4   Title Independent in HEP with wife's assistance 08/01/16   Time 6   Period Weeks  Status On-going     PT LONG TERM GOAL #5   Title Improve FOTO to </= 42% limitation 08/01/16   Time 6   Period Weeks   Status On-going             Patient will benefit from skilled therapeutic intervention in order to improve the following deficits and impairments:     Visit Diagnosis: Cervicalgia  Other symptoms and signs involving the musculoskeletal system  Unsteadiness on feet  Muscle weakness (generalized)     Problem List Patient Active Problem List   Diagnosis Date Noted  . Cervical paraspinal muscle spasm 05/17/2016  . Memory loss 06/02/2015  . Lumbar degenerative disc disease 05/10/2013    Tea Collums Rober MinionP Verl Whitmore PT, MPH 07/28/2016, 3:28 PM  [facility] [address] [place] [address] [place], [place], [address] Phone: [phone]   Fax:  [phone]  Name: Laural RoesCharles Molloy MRN: [id number] Date of Birth: 12/07/1924

## 2016-08-01 ENCOUNTER — Encounter: Payer: Self-pay | Admitting: Rehabilitative and Restorative Service Providers"

## 2016-08-01 ENCOUNTER — Ambulatory Visit (INDEPENDENT_AMBULATORY_CARE_PROVIDER_SITE_OTHER): Payer: Medicare HMO | Admitting: Rehabilitative and Restorative Service Providers"

## 2016-08-01 DIAGNOSIS — R29898 Other symptoms and signs involving the musculoskeletal system: Secondary | ICD-10-CM

## 2016-08-01 DIAGNOSIS — M542 Cervicalgia: Secondary | ICD-10-CM

## 2016-08-01 DIAGNOSIS — M6281 Muscle weakness (generalized): Secondary | ICD-10-CM

## 2016-08-01 DIAGNOSIS — R2681 Unsteadiness on feet: Secondary | ICD-10-CM

## 2016-08-01 NOTE — Therapy (Addendum)
[facility] [facility] [address] [place] [address] [place], [place], [address] Phone: [phone]   Fax:  [phone]  Physical Therapy Treatment  Patient Details  Name: Juan Carrillo MRN: [id number] Date of Birth: 03/23/1925 Referring Provider: Dr Clementeen GrahamEvan Corey   Encounter Date: 08/01/2016      PT End of Session - 08/01/16 1433    Visit Number 10   Number of Visits 24   Date for PT Re-Evaluation 09/13/16   PT Start Time 1434   PT Stop Time 1517   PT Time Calculation (min) 43 min   Activity Tolerance Patient tolerated treatment well      Past Medical History:  Diagnosis Date  . Lumbar degenerative disc disease 05/10/2013  . Memory loss 06/02/2015    History reviewed. No pertinent surgical history.  There were no vitals filed for this visit.      Subjective Assessment - 08/01/16 1438    Subjective No pain. Juan Carrillo reports that Juan Carrillo is getting in and out of his chair and bed with greater ease and more independence. He has no pain in the neck. Juan Carrillo feels Juan Carrillo will benefit form continued PT to gain further strength and independence for home.    Currently in Pain? No/denies            [facility] PT Assessment - 08/01/16 0001      Assessment   Medical Diagnosis paraspinal cervical muscle spasm; unsteadiness    Referring Provider Dr Clementeen GrahamEvan Corey    Onset Date/Surgical Date 05/12/16   Hand Dominance Right   Next MD Visit PRN      Observation/Other Assessments   Focus on Therapeutic Outcomes (FOTO)  50% limitation      AROM   Cervical Flexion 40   Cervical Extension 22   Cervical - Right Side Bend 18   Cervical - Left Side Bend 15   Cervical - Right Rotation 53   Cervical - Left Rotation 52     Strength   Overall Strength Comments strength grossly 4/5 to 5-/5 bilat LE's hip ext 3/5      Flexibility   Hamstrings tight Rt 61 deg; Lt 63 deg   Quadriceps tight Rt 100 deg; Lt 105 deg      Palpation   Palpation comment  improved muscular tightness bilat ant/lat/posterior cerivcal musculature; upper traps; leveator; pecs; mid thoracic paraspinals bilat - Rt > lt      Transfers   Comments moves with greater ease to change positions - able to roll to prone -which he was unable to do at eval      [employer] Balance Test   Sit to Stand Able to stand  independently using hands   Standing Unsupported Able to stand safely 2 minutes   Sitting with Back Unsupported but Feet Supported on Floor or Stool Able to sit safely and securely 2 minutes   Stand to Sit Sits safely with minimal use of hands   Transfers Able to transfer safely, definite need of hands   Standing Unsupported with Eyes Closed Able to stand 10 seconds safely   Standing Ubsupported with Feet Together Able to place feet together independently and stand 1 minute safely   From Standing, Reach Forward with Outstretched Arm Can reach confidently >25 cm (10")   From Standing Position, Pick up Object from Floor Able to pick up shoe safely and easily   From Standing Position, Turn to Look Behind Over each Shoulder Looks behind from both sides and weight  shifts well   Turn 360 Degrees Able to turn 360 degrees safely but slowly   Standing Unsupported, Alternately Place Feet on Step/Stool Able to complete >2 steps/needs minimal assist   Standing Unsupported, One Foot in [employer] balance while stepping or standing   Standing on One Leg Tries to lift leg/unable to hold 3 seconds but remains standing independently   Total Score 42   Berg comment: High risk for falls                      [facility] Adult PT Treatment/Exercise - 08/01/16 0001      Lumbar Exercises: Standing   Heel Raises 20 reps  UE support at stair rails    Functional Squats 20 reps  unsupported - VC for posture    Functional Squats Limitations step ups x 30 each LE 6 inch step; alt touching toe to 12 inch stair x 10 each LE x 2 sets; SLS 10 sec x 5 reps each LE 2 HH at railing    Other  Standing Lumbar Exercises marching x 20 each LE UE support at counter; standing posterior hips at counter pushing hips away from counter to encourage trunk and LE extension 2 sets of 10; SLS 10-20 sec x 5 each LE; step up leading with each LE x 10 on 6 inch step      Lumbar Exercises: Seated   Sit to Stand Limitations sit to stand x 10 from standard chair-  with UE support to no UE support with arms forward, hands resting on PT's hands encouraging forward wt shift with sit to stand; body blade bilat hands for elbow flexion/extension x 20-30 sec x 5                      PT Long Term Goals - 08/01/16 1437      PT LONG TERM GOAL #1   Title Patient to demonstrate upright posture and alignment with head over body and improved trunk; hip and knee extension 09/13/16   Time 12   Period Weeks   Status Revised     PT LONG TERM GOAL #2   Title Increase cervical ROM by 5-8 degrees in all planes allowing patient to maintain improved posture and turn head functionally 08/01/16    Time 12   Period Weeks   Status Achieved     PT LONG TERM GOAL #3   Title Improve Berg Balance Score by 5-8 points to decrease fall risk 09/13/16   Time 12   Period Weeks   Status Revised     PT LONG TERM GOAL #4   Title Independent in HEP with wife's assistance 09/13/16   Time 12   Period Weeks   Status Revised     PT LONG TERM GOAL #5   Title Improve FOTO to </= 42% limitation 09/13/16   Time 12   Period Weeks   Status Revised               Plan - 08/01/16 1510    Clinical Impression Statement Juan Carrillo continues to demonstrate good gains in therapy. Cervical pain has resolved; he is moving to change positions with greater ease; LE strength is in increased. goals of therapy are partially accomplished. Patient will benefit from continued therapy to accomplish goals and reach maximum rehab potential.    Rehab Potential Good   PT Frequency 2x / week   PT Duration 6 weeks   PT  Treatment/Interventions Patient/family education;ADLs/Self Care Home Management;Neuromuscular re-education;Cryotherapy;Electrical Stimulation;Iontophoresis 4mg /ml Dexamethasone;Moist Heat;Ultrasound;Dry needling;Manual techniques;Therapeutic activities;Therapeutic exercise;Balance training;Functional mobility training;Gait training;Stair training   PT Next Visit Plan cont, postural correction; strengthening; balance and gait activities    Consulted and Agree with Plan of Care Patient      Patient will benefit from skilled therapeutic intervention in order to improve the following deficits and impairments:  Postural dysfunction, Decreased strength, Decreased mobility, Pain, Impaired flexibility, Increased muscle spasms, Decreased safety awareness, Decreased range of motion, Difficulty walking  Visit Diagnosis: Cervicalgia - Plan: PT plan of care cert/re-cert  Other symptoms and signs involving the musculoskeletal system - Plan: PT plan of care cert/re-cert  Unsteadiness on feet - Plan: PT plan of care cert/re-cert  Muscle weakness (generalized) - Plan: PT plan of care cert/re-cert     Problem List Patient Active Problem List   Diagnosis Date Noted  . Cervical paraspinal muscle spasm 05/17/2016  . Memory loss 06/02/2015  . Lumbar degenerative disc disease 05/10/2013    Leonid Manus Rober Minion PT, MPH  08/01/2016, 3:16 PM  [facility] [address] [place] [address] [place], [place], [address] Phone: [phone]   Fax:  [phone]  Name: Cordney Barstow MRN: [id number] Date of Birth: 06/13/1924

## 2016-08-04 ENCOUNTER — Ambulatory Visit (INDEPENDENT_AMBULATORY_CARE_PROVIDER_SITE_OTHER): Payer: Medicare HMO | Admitting: Rehabilitative and Restorative Service Providers"

## 2016-08-04 ENCOUNTER — Encounter: Payer: Self-pay | Admitting: Rehabilitative and Restorative Service Providers"

## 2016-08-04 DIAGNOSIS — R2681 Unsteadiness on feet: Secondary | ICD-10-CM | POA: Diagnosis not present

## 2016-08-04 DIAGNOSIS — M542 Cervicalgia: Secondary | ICD-10-CM | POA: Diagnosis not present

## 2016-08-04 DIAGNOSIS — R29898 Other symptoms and signs involving the musculoskeletal system: Secondary | ICD-10-CM

## 2016-08-04 DIAGNOSIS — M6281 Muscle weakness (generalized): Secondary | ICD-10-CM | POA: Diagnosis not present

## 2016-08-04 NOTE — Therapy (Signed)
[facility] [address] [place], [place], [address] Phone: [phone]   Fax:  [phone]  Physical Therapy Treatment  Patient Details  Name: [name] MRN: [id number] Date of Birth: 09/13/1924 Referring Provider: Dr Clementeen GrahamEvan Corey   Encounter Date: 08/04/2016      PT End of Session - 08/04/16 1542    Visit Number 10   Number of Visits 24   Date for PT Re-Evaluation 09/13/16   Activity Tolerance Patient tolerated treatment well      Past Medical History:  Diagnosis Date  . Lumbar degenerative disc disease 05/10/2013  . Memory loss 06/02/2015    History reviewed. No pertinent surgical history.  There were no vitals filed for this visit.      Subjective Assessment - 08/04/16 1527    Subjective No pain. Juan Carrillo reports that Juan Carrillo is getting in and out of his chair and bed with greater ease and more independence.    Currently in Pain? Yes                         [facility] Adult PT Treatment/Exercise - 08/04/16 0001      Neuro Re-ed    Neuro Re-ed Details  standing encouraging extensioin of trunk; hips; knees - reaching overhead with extension multiple trials with verbal and tactile cues of PT      Lumbar Exercises: Stretches   Quad Stretch 2 reps;30 seconds;60 seconds  PT assist with pt sidelying LE resting on bolster      Lumbar Exercises: Standing   Other Standing Lumbar Exercises marching x 20 each LE UE support at counter; standing posterior hips at counter pushing hips away from counter to encourage trunk and LE extension 2 sets of 10; SLS 10-20 sec x 5 each LE; step up leading with each LE x 10 on 6 inch step      Lumbar Exercises: Seated   Sit to Stand Limitations sit to stand x 10 from low treatment table - use of UE/flrearms/PT assist working to decreae the amount of support needed. working on UE exercises including use of 2# wts for elbow flexion; scaptioin; punches; overhead press;  ab/add x 10 each                      PT Long Term Goals - 08/01/16 1437      PT LONG TERM GOAL #1   Title Patient to demonstrate upright posture and alignment with head over body and improved trunk; hip and knee extension 09/13/16   Time 12   Period Weeks   Status Revised     PT LONG TERM GOAL #2   Title Increase cervical ROM by 5-8 degrees in all planes allowing patient to maintain improved posture and turn head functionally 08/01/16    Time 12   Period Weeks   Status Achieved     PT LONG TERM GOAL #3   Title Improve Berg Balance Score by 5-8 points to decrease fall risk 09/13/16   Time 12   Period Weeks   Status Revised     PT LONG TERM GOAL #4   Title Independent in HEP with wife's assistance 09/13/16   Time 12   Period Weeks   Status Revised     PT LONG TERM GOAL #5   Title Improve FOTO to </= 42% limitation 09/13/16   Time 12   Period Weeks   Status Revised  Plan - 08/04/16 1527    Clinical Impression Statement Continued improvement noted in trunk, hip and knee extension in standing.    Rehab Potential Good   PT Frequency 2x / week   PT Duration 6 weeks   PT Treatment/Interventions Patient/family education;ADLs/Self Care Home Management;Neuromuscular re-education;Cryotherapy;Electrical Stimulation;Iontophoresis 4mg /ml Dexamethasone;Moist Heat;Ultrasound;Dry needling;Manual techniques;Therapeutic activities;Therapeutic exercise;Balance training;Functional mobility training;Gait training;Stair training   PT Next Visit Plan cont, postural correction; strengthening; balance and gait activities -    Consulted and Agree with Plan of Care Patient      Patient will benefit from skilled therapeutic intervention in order to improve the following deficits and impairments:  Postural dysfunction, Decreased strength, Decreased mobility, Pain, Impaired flexibility, Increased muscle spasms, Decreased safety awareness, Decreased range of motion,  Difficulty walking  Visit Diagnosis: Cervicalgia  Other symptoms and signs involving the musculoskeletal system  Unsteadiness on feet  Muscle weakness (generalized)     Problem List Patient Active Problem List   Diagnosis Date Noted  . Cervical paraspinal muscle spasm 05/17/2016  . Memory loss 06/02/2015  . Lumbar degenerative disc disease 05/10/2013    Niya Behler Rober Minion PT, MPH 08/04/2016, 3:53 PM  [facility] [address] [place], [place], [address] Phone: [phone]   Fax:  [phone]  Name: Juan Carrillo MRN: [id number] Date of Birth: May 18, 1924

## 2016-08-08 ENCOUNTER — Encounter: Payer: Medicare HMO | Admitting: Rehabilitative and Restorative Service Providers"

## 2016-08-11 ENCOUNTER — Encounter: Payer: Medicare HMO | Admitting: Rehabilitative and Restorative Service Providers"

## 2016-08-15 ENCOUNTER — Encounter: Payer: Medicare HMO | Admitting: Rehabilitative and Restorative Service Providers"

## 2016-08-15 ENCOUNTER — Ambulatory Visit (INDEPENDENT_AMBULATORY_CARE_PROVIDER_SITE_OTHER): Payer: Medicare HMO | Admitting: Physical Therapy

## 2016-08-15 DIAGNOSIS — M6281 Muscle weakness (generalized): Secondary | ICD-10-CM

## 2016-08-15 DIAGNOSIS — R29898 Other symptoms and signs involving the musculoskeletal system: Secondary | ICD-10-CM

## 2016-08-15 DIAGNOSIS — M542 Cervicalgia: Secondary | ICD-10-CM | POA: Diagnosis not present

## 2016-08-15 DIAGNOSIS — R2681 Unsteadiness on feet: Secondary | ICD-10-CM

## 2016-08-15 NOTE — Therapy (Signed)
[facility] [address] [place], [place], [address] Phone: [phone]   Fax:  [phone]  Physical Therapy Treatment  Patient Details  Name: Juan Carrillo MRN: [id number] Date of Birth: 08/02/1924 Referring Provider: Dr. Clementeen GrahamEvan Corey   Encounter Date: 08/15/2016      PT End of Session - 08/15/16 1608    Visit Number 12   Number of Visits 24   Date for PT Re-Evaluation 09/13/16   PT Start Time 1520   PT Stop Time 1558   PT Time Calculation (min) 38 min   Activity Tolerance Patient tolerated treatment well;No increased pain;Patient limited by fatigue      Past Medical History:  Diagnosis Date  . Lumbar degenerative disc disease 05/10/2013  . Memory loss 06/02/2015    No past surgical history on file.  There were no vitals filed for this visit.      Subjective Assessment - 08/15/16 1541    Subjective Per wife, pt is on new medication that is to help his gait.  She said pt is now getting out of bed and dressing himself now.     Pertinent History LBP; dementia; arthritic changes    Pain Score 0-No pain            [facility] PT Assessment - 08/15/16 0001      Assessment   Medical Diagnosis paraspinal cervical muscle spasm; unsteadiness    Referring Provider Dr. Clementeen GrahamEvan Corey    Onset Date/Surgical Date 05/12/16   Hand Dominance Right   Next MD Visit PRN    Prior Therapy yes - 2 appts at outside facility           [facility] - 08/15/16 0001      Lumbar Exercises: Stretches   Passive Hamstring Stretch 2 reps;30 seconds  seated, straight back     Lumbar Exercises: Aerobic   UBE (Upper Arm Bike) seated: L1 1 min each direction.      Lumbar Exercises: Standing   Other Standing Lumbar Exercises SLS on each leg x 3 sec with light UE support.   Reaching overhead, sliding hands up the cabinets with heel lift x 10 reps, 2 sets.   Toe tape to Wipes container on floor, alternating feet,  unilateral support x 10.  Wide stance, cross over reach to target just outside base of support on countertop x 10.  brief, seated rest break between exercise   Other Standing Lumbar Exercises High knee marching x 12 reps, unilateral support.      Lumbar Exercises: Seated   Sit to Stand Limitations sit to stand x 10 from chair  - use of UE/PTA assist working to decrease the amount of support needed.  UE exercises including use of 3# wts for elbow flexion x 15, 2 sets; scaption with 2# x 10; punches with yellow band x 10, 2 sets; overhead press 2# x 10 ; abdct/add x 10 each (no weight) power UP move with clap each side.   Hip abdct and same side overhead reach x 5, tactile cues for improved form.              PT Long Term Goals - 08/15/16 1644      PT LONG TERM GOAL #1   Title Patient to demonstrate upright posture and alignment with head over body and improved trunk; hip and knee extension 09/13/16   Time 12   Period Weeks   Status On-going     PT  LONG TERM GOAL #2   Title Increase cervical ROM by 5-8 degrees in all planes allowing patient to maintain improved posture and turn head functionally 08/01/16    Time 12   Period Weeks   Status Achieved     PT LONG TERM GOAL #3   Title Improve Berg Balance Score by 5-8 points to decrease fall risk 09/13/16   Time 12   Period Weeks   Status On-going     PT LONG TERM GOAL #4   Title Independent in HEP with wife's assistance 09/13/16   Time 12   Period Weeks   Status On-going     PT LONG TERM GOAL #5   Title Improve FOTO to </= 42% limitation 09/13/16   Time 12   Period Weeks   Status On-going               Plan - 08/15/16 1644    Clinical Impression Statement Pt required some cues (tactile/VC) for safety when attempting to sit; he descends before fully turned around in front of chair.  Posture improves with frequent cues.  He required some brief seated rest breaks between exercises due to fatigue. Gradually progressing towards  established goals.    Rehab Potential Good   PT Frequency 2x / week   PT Duration 6 weeks   PT Treatment/Interventions Patient/family education;ADLs/Self Care Home Management;Neuromuscular re-education;Cryotherapy;Electrical Stimulation;Iontophoresis 4mg /ml Dexamethasone;Moist Heat;Ultrasound;Dry needling;Manual techniques;Therapeutic activities;Therapeutic exercise;Balance training;Functional mobility training;Gait training;Stair training   PT Next Visit Plan cont, postural correction; strengthening; balance and gait activities -    Consulted and Agree with Plan of Care Patient      Patient will benefit from skilled therapeutic intervention in order to improve the following deficits and impairments:  Postural dysfunction, Decreased strength, Decreased mobility, Pain, Impaired flexibility, Increased muscle spasms, Decreased safety awareness, Decreased range of motion, Difficulty walking  Visit Diagnosis: Cervicalgia  Other symptoms and signs involving the musculoskeletal system  Unsteadiness on feet  Muscle weakness (generalized)     Problem List Patient Active Problem List   Diagnosis Date Noted  . Cervical paraspinal muscle spasm 05/17/2016  . Memory loss 06/02/2015  . Lumbar degenerative disc disease 05/10/2013   Mayer Camel, PTA 08/15/16 4:48 PM  [facility] [address] [place], [place], [address] Phone: [phone]   Fax:  [phone]  Name: Juan Carrillo MRN: [id number] Date of Birth: 11/26/1924

## 2016-08-18 ENCOUNTER — Ambulatory Visit (INDEPENDENT_AMBULATORY_CARE_PROVIDER_SITE_OTHER): Payer: Medicare HMO | Admitting: Rehabilitative and Restorative Service Providers"

## 2016-08-18 ENCOUNTER — Encounter: Payer: Medicare HMO | Admitting: Rehabilitative and Restorative Service Providers"

## 2016-08-18 ENCOUNTER — Encounter: Payer: Self-pay | Admitting: Rehabilitative and Restorative Service Providers"

## 2016-08-18 DIAGNOSIS — M6281 Muscle weakness (generalized): Secondary | ICD-10-CM

## 2016-08-18 DIAGNOSIS — R2681 Unsteadiness on feet: Secondary | ICD-10-CM | POA: Diagnosis not present

## 2016-08-18 DIAGNOSIS — R29898 Other symptoms and signs involving the musculoskeletal system: Secondary | ICD-10-CM

## 2016-08-18 NOTE — Therapy (Signed)
[facility] [place] [address] [place], [place], [address] Phone: [phone]   Fax:  [phone]  Physical Therapy Treatment  Patient Details  Name: Juan Carrillo MRN: [id number] Date of Birth: 1925-02-02 Referring Provider: Dr. Clementeen Graham   Encounter Date: 08/18/2016      PT End of Session - 08/18/16 1448    Visit Number 13   Number of Visits 24   Date for PT Re-Evaluation 09/13/16   PT Start Time 1448   PT Stop Time 1533   PT Time Calculation (min) 45 min   Activity Tolerance Patient tolerated treatment well      Past Medical History:  Diagnosis Date  . Lumbar degenerative disc disease 05/10/2013  . Memory loss 06/02/2015    History reviewed. No pertinent surgical history.  There were no vitals filed for this visit.      Subjective Assessment - 08/18/16 1449    Subjective No pain. Needs rest breaks    Currently in Pain? No/denies                         [facility] Adult PT Treatment/Exercise - 08/18/16 0001      Neuro Re-ed    Neuro Re-ed Details  standing encouraging extensioin of trunk; hips; knees - reaching overhead with extension multiple trials with verbal and tactile cues of PT      Lumbar Exercises: Standing   Other Standing Lumbar Exercises SLS on each leg x 3 sec with light UE support.   Reaching overhead, sliding hands up the cabinets with heel lift x 10 reps, 2 sets.   Toe tape fwd/side/back/together, alternating feet, unilateral support x 10.     Other Standing Lumbar Exercises standing with good posture and alignmnet tightening gluts and extending into upright position; standing working on reaching to side and overhead; standing tall to turn to look to one side then the other x 2 each side maintaining balance      Lumbar Exercises: Seated   Sit to Stand Limitations sit to stand x 10 from table - use of UE/PT assist working to decrease the amount of UE support needed.       Lumbar  Exercises: Supine   Bridge 10 reps;5 seconds   Other Supine Lumbar Exercises trunk rotation to either side      Lumbar Exercises: Sidelying   Other Sidelying Lumbar Exercises hip protraction in supported position - on each side      Manual Therapy   Passive ROM PROM and stretch for hip flexors with LE resting on bolster - PT stretching and doing some relaxation and releaes techniques through the posterior hip stretching hip flexors                      PT Long Term Goals - 08/15/16 1644      PT LONG TERM GOAL #1   Title Patient to demonstrate upright posture and alignment with head over body and improved trunk; hip and knee extension 09/13/16   Time 12   Period Weeks   Status On-going     PT LONG TERM GOAL #2   Title Increase cervical ROM by 5-8 degrees in all planes allowing patient to maintain improved posture and turn head functionally 08/01/16    Time 12   Period Weeks   Status Achieved     PT LONG TERM GOAL #3   Title Improve Berg Balance Score by 5-8 points  to decrease fall risk 09/13/16   Time 12   Period Weeks   Status On-going     PT LONG TERM GOAL #4   Title Independent in HEP with wife's assistance 09/13/16   Time 12   Period Weeks   Status On-going     PT LONG TERM GOAL #5   Title Improve FOTO to </= 42% limitation 09/13/16   Time 12   Period Weeks   Status On-going               Plan - 08/18/16 1535    Clinical Impression Statement Patient continues to demonstrate improved trunk and LE extension in standing. Works well in PT with increased endurance and fewer rest periods.    Rehab Potential Good   PT Frequency 2x / week   PT Duration 6 weeks   PT Treatment/Interventions Patient/family education;ADLs/Self Care Home Management;Neuromuscular re-education;Cryotherapy;Electrical Stimulation;Iontophoresis 4mg /ml Dexamethasone;Moist Heat;Ultrasound;Dry needling;Manual techniques;Therapeutic activities;Therapeutic exercise;Balance  training;Functional mobility training;Gait training;Stair training   PT Next Visit Plan cont, postural correction; strengthening; balance and gait activities -    Consulted and Agree with Plan of Care Patient      Patient will benefit from skilled therapeutic intervention in order to improve the following deficits and impairments:  Postural dysfunction, Decreased strength, Decreased mobility, Pain, Impaired flexibility, Increased muscle spasms, Decreased safety awareness, Decreased range of motion, Difficulty walking  Visit Diagnosis: Other symptoms and signs involving the musculoskeletal system  Unsteadiness on feet  Muscle weakness (generalized)     Problem List Patient Active Problem List   Diagnosis Date Noted  . Cervical paraspinal muscle spasm 05/17/2016  . Memory loss 06/02/2015  . Lumbar degenerative disc disease 05/10/2013    Oliva Montecalvo Rober MinionP Kortnee Bas PT, MPH  08/18/2016, 3:37 PM  [facility] [address] [place], [place], [address] Phone: [phone]   Fax:  [phone]  Name: Juan Carrillo MRN: [id number] Date of Birth: 01/11/1925

## 2016-08-24 ENCOUNTER — Encounter: Payer: Self-pay | Admitting: Rehabilitative and Restorative Service Providers"

## 2016-08-24 ENCOUNTER — Ambulatory Visit (INDEPENDENT_AMBULATORY_CARE_PROVIDER_SITE_OTHER): Payer: Medicare HMO | Admitting: Rehabilitative and Restorative Service Providers"

## 2016-08-24 DIAGNOSIS — R2681 Unsteadiness on feet: Secondary | ICD-10-CM | POA: Diagnosis not present

## 2016-08-24 DIAGNOSIS — M6281 Muscle weakness (generalized): Secondary | ICD-10-CM | POA: Diagnosis not present

## 2016-08-24 DIAGNOSIS — R29898 Other symptoms and signs involving the musculoskeletal system: Secondary | ICD-10-CM | POA: Diagnosis not present

## 2016-08-24 NOTE — Therapy (Signed)
[place], [place], [address] Phone: [phone]   Fax:  [phone]  Physical Therapy Treatment  Patient Details  Name: Juan Carrillo MRN: [id number] Date of Birth: [birth year] Referring Provider: Dr. Lynne Leader   Encounter Date: 08/24/2016      PT End of Session - 08/24/16 1520    Visit Number 14   Number of Visits 24   Date for PT Re-Evaluation 09/13/16   PT Start Time 1509   PT Stop Time 1600   PT Time Calculation (min) 51 min   Activity Tolerance Patient tolerated treatment well      Past Medical History:  Diagnosis Date  . Lumbar degenerative disc disease 05/10/2013  . Memory loss 06/02/2015    History reviewed. No pertinent surgical history.  There were no vitals filed for this visit.      Subjective Assessment - 08/24/16 1527    Subjective Doing well. Wife notices that patient is more indepenedent at home - can now dress himself in the mornings. No pain    Currently in Pain? No/denies                         [facility] Adult PT Treatment/Exercise - 08/24/16 0001      Neuro Re-ed    Neuro Re-ed Details  standing encouraging extensioin of trunk; hips; knees - reaching overhead with extension multiple trials with verbal and tactile cues of PT      Lumbar Exercises: Standing   Other Standing Lumbar Exercises SLS on each leg x 3 sec with UE support. Toe tape fwd/side/back/together, alternating feet, unilateral support x 10.     Other Standing Lumbar Exercises standing with good posture and alignmnet tightening gluts and extending into upright position; standing working on reaching to side and overhead; standing tall to turn to look to one side then the other x 2 each side maintaining balance      Lumbar Exercises: Seated   Sit to Stand Limitations sit to stand x 10 from table - use of UE/PT assist working to decrease the amount of UE support needed.       Lumbar  Exercises: Sidelying   Hip Abduction 10 reps  PT assist as needed    Other Sidelying Lumbar Exercises hip protraction in supported position - on each side    Other Sidelying Lumbar Exercises clam w/ foot supported on bolster - partial range x 10 each LE      Lumbar Exercises: Prone   Other Prone Lumbar Exercises glut set straightening knee with toe in wt bearing alt LE's x 10                     PT Long Term Goals - 08/24/16 1521      PT LONG TERM GOAL #1   Title Patient to demonstrate upright posture and alignment with head over body and improved trunk; hip and knee extension 09/13/16   Time 12   Period Weeks   Status Partially Met     PT LONG TERM GOAL #2   Title Increase cervical ROM by 5-8 degrees in all planes allowing patient to maintain improved posture and turn head functionally 08/01/16    Time 12   Period Weeks   Status Achieved     PT LONG TERM GOAL #3   Title Improve Berg Balance Score by 5-8 points to decrease fall risk 09/13/16   Time 12  Period Weeks   Status Partially Met     PT LONG TERM GOAL #4   Title Independent in HEP with wife's assistance 09/13/16   Time 12   Period Weeks   Status On-going     PT LONG TERM GOAL #5   Title Improve FOTO to </= 42% limitation 09/13/16   Time 12   Period Weeks   Status On-going               Plan - 08/24/16 1608    Clinical Impression Statement Working to improve extensibility and mobility through hip flexors in various positions. Continued increase in endurance and exercise tolerance noted. Progressing gradually toward stated goals of therapy.    Rehab Potential Good   PT Frequency 2x / week   PT Duration 6 weeks   PT Treatment/Interventions Patient/family education;ADLs/Self Care Home Management;Neuromuscular re-education;Cryotherapy;Electrical Stimulation;Iontophoresis 55m/ml Dexamethasone;Moist Heat;Ultrasound;Dry needling;Manual techniques;Therapeutic activities;Therapeutic exercise;Balance  training;Functional mobility training;Gait training;Stair training   PT Next Visit Plan cont, postural correction; strengthening; balance and gait activities -    Consulted and Agree with Plan of Care Patient      Patient will benefit from skilled therapeutic intervention in order to improve the following deficits and impairments:  Postural dysfunction, Decreased strength, Decreased mobility, Pain, Impaired flexibility, Increased muscle spasms, Decreased safety awareness, Decreased range of motion, Difficulty walking  Visit Diagnosis: Other symptoms and signs involving the musculoskeletal system  Unsteadiness on feet  Muscle weakness (generalized)     Problem List Patient Active Problem List   Diagnosis Date Noted  . Cervical paraspinal muscle spasm 05/17/2016  . Memory loss 06/02/2015  . Lumbar degenerative disc disease 05/10/2013    Margarito Dehaas PNilda SimmerPT, MPH  08/24/2016, 4:10 PM  [facility] [address]Phone: [phone]  Fax:  [phone] Name: Juan JoslinMRN: [id number] of Birth: [birth year]

## 2016-08-26 ENCOUNTER — Encounter: Payer: Medicare HMO | Admitting: Rehabilitative and Restorative Service Providers"

## 2016-08-29 ENCOUNTER — Encounter: Payer: Medicare HMO | Admitting: Physical Therapy

## 2016-09-01 ENCOUNTER — Encounter: Payer: Medicare HMO | Admitting: Physical Therapy

## 2016-09-05 ENCOUNTER — Encounter: Payer: Self-pay | Admitting: Physical Therapy

## 2016-09-08 ENCOUNTER — Encounter: Payer: Medicare HMO | Admitting: Rehabilitative and Restorative Service Providers"

## 2016-09-12 ENCOUNTER — Encounter: Payer: Medicare HMO | Admitting: Rehabilitative and Restorative Service Providers"

## 2016-10-12 ENCOUNTER — Ambulatory Visit (INDEPENDENT_AMBULATORY_CARE_PROVIDER_SITE_OTHER): Payer: Medicare HMO | Admitting: Rehabilitative and Restorative Service Providers"

## 2016-10-12 ENCOUNTER — Encounter: Payer: Self-pay | Admitting: Rehabilitative and Restorative Service Providers"

## 2016-10-12 DIAGNOSIS — M545 Low back pain, unspecified: Secondary | ICD-10-CM

## 2016-10-12 DIAGNOSIS — M6281 Muscle weakness (generalized): Secondary | ICD-10-CM | POA: Diagnosis not present

## 2016-10-12 DIAGNOSIS — R2681 Unsteadiness on feet: Secondary | ICD-10-CM | POA: Diagnosis not present

## 2016-10-12 DIAGNOSIS — R29898 Other symptoms and signs involving the musculoskeletal system: Secondary | ICD-10-CM | POA: Diagnosis not present

## 2016-10-12 NOTE — Therapy (Signed)
[facility] [place] [address] [place], [place], [address] Phone: [phone]   Fax:  [phone]  Physical Therapy Evaluation  Patient Details  Name: Juan Carrillo MRN: [id number] Date of Birth: 1925/01/14 Referring Provider: Dr Charmayne Sheer   Encounter Date: 10/12/2016      PT End of Session - 10/12/16 1519    Visit Number 1   Number of Visits 8   Date for PT Re-Evaluation 11/23/16   PT Start Time 1515   PT Stop Time 1605   PT Time Calculation (min) 50 min   Activity Tolerance Patient tolerated treatment well      Past Medical History:  Diagnosis Date  . Lumbar degenerative disc disease 05/10/2013  . Memory loss 06/02/2015    History reviewed. No pertinent surgical history.  There were no vitals filed for this visit.       Subjective Assessment - 10/12/16 1523    Subjective Patient's wife reports that he has had LBP off and on for several years with a flare up of symptoms in the past several weeks. He has incresaed pain with functional activities - difficult since patient does not always report pain. She notices that he is less independent in AFL's and functional activities. Recent change in medication may be helping incresae activity level.    Pertinent History LBP; dementia; arthritic changes    How long can you sit comfortably? sits in a sofa or chair    How long can you stand comfortably? a few minutes    How long can you walk comfortably? 5- 10 minutes - slowly    Diagnostic tests -   Patient Stated Goals decrease LBP and improve functional mobility    Currently in Pain? No/denies   Pain Location Back   Pain Orientation Lower   Pain Descriptors / Indicators Aching;Dull   Pain Type Chronic pain   Pain Onset More than a month ago   Pain Frequency Intermittent   Aggravating Factors  exercising    Pain Relieving Factors heat; topical analgesic             [facility] PT Assessment - 10/12/16 0001      Assessment   Medical Diagnosis LBP    Referring Provider Dr Charmayne Sheer    Onset Date/Surgical Date 09/09/16   Hand Dominance Right   Next MD Visit 8/18   Prior Therapy yes      Balance Screen   Has the patient fallen in the past 6 months No   Has the patient had a decrease in activity level because of a fear of falling?  No   Is the patient reluctant to leave their home because of a fear of falling?  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    Leisure sedentary - sits on sofa      Cognition   Overall Cognitive Status --  demential      Sensation   Additional Comments WFL's      Posture/Postural Control   Posture Comments head forward; shoulders rounded and elevated; incresaed thoracic kyphosis; forward flexed through trunk and hips; knees  flexed      Strength   Overall Strength Comments strength grossly 4+/5 to 5-/5 bilat LE's hip ext 3+/5      Flexibility   Hamstrings tight Rt 73 deg; Lt 68 deg   Quadriceps tight Rt 100 deg; Lt 105 deg      Palpation   Palpation comment muscular tightness bilat lumbar paraspinals      Transfers   Comments rolling independent ly to change positions and come to sit      Ambulation/Gait   Gait Comments ambulates with SPC Rt - flexed forward gait as noted above; unsteady      Berg Balance Test   Sit to Stand Able to stand  independently using hands   Standing Unsupported Able to stand safely 2 minutes   Sitting with Back Unsupported but Feet Supported on Floor or Stool Able to sit safely and securely 2 minutes   Stand to Sit Sits safely with minimal use of hands   Transfers Able to transfer safely,  definite need of hands   Standing Unsupported with Eyes Closed Able to stand 10 seconds safely   Standing Ubsupported with Feet Together Able to place feet together independently and stand 1 minute safely   From Standing, Reach Forward with Outstretched Arm Can reach confidently >25 cm (10")   From Standing Position, Pick up Object from Floor Able to pick up shoe safely and easily   From Standing Position, Turn to Look Behind Over each Shoulder Looks behind from both sides and weight shifts well   Turn 360 Degrees Able to turn 360 degrees safely but slowly   Standing Unsupported, Alternately Place Feet on Step/Stool Able to complete >2 steps/needs minimal assist   Standing Unsupported, One Foot in [employer] balance while stepping or standing   Standing on One Leg Tries to lift leg/unable to hold 3 seconds but remains standing independently   Total Score 42   Berg comment: high risk for falls            Objective measurements completed on examination: See above findings.          [facility] Adult PT Treatment/Exercise - 10/12/16 0001      Lumbar Exercises: Stretches   Passive Hamstring Stretch 2 reps;20 seconds  supine    Standing Extension Limitations working on standing with trunk; hips and knees extended      Lumbar Exercises: Seated   Sit to Stand Limitations sit to stand with slow stand to sit x 10      Lumbar Exercises: Supine   Bridge 10 reps;5 seconds  hip adduction w/ bridge 10 reps x 5 sec hold                 PT Education - 10/12/16 1555    Education provided Yes   Education Details HEP    Person(s) Educated Patient;Spouse   Methods Explanation;Demonstration;Tactile cues;Verbal cues;Handout   Comprehension Verbalized understanding;Returned demonstration;Verbal cues required;Tactile cues required             PT Long Term Goals - 10/12/16 1705      PT LONG TERM GOAL #1   Title Patient to demonstrate upright posture and alignment with head over  body and improved trunk; hip and knee extension 11/23/16   Time 6   Period Weeks   Status New     PT LONG TERM GOAL #2   Title Increase trunk and hip/knee extension allowing patient to maintain improved posture and turn head functionally  11/23/16    Time 6   Period Weeks   Status New     PT LONG TERM GOAL #3   Title Improve Berg Balance Score by 5-8 points to decrease fall risk 11/23/16   Time 6   Period Weeks   Status New     PT LONG TERM GOAL #4   Title Independent in HEP with wife's assistance 11/23/16   Time 6   Period Weeks   Status New     PT LONG TERM GOAL #5   Title Decrease pain in the LB whe patient reports flare up of symptoms - as indicated 11/23/16   Time 6   Period Weeks   Status New                Plan - 10/12/16 1601    Clinical Impression Statement Billey GoslingCharlie presents with intermittent LBP and decreased mobility and functional activity level. He has limited trunk and LE ROM/mobility; decreased strength bilat LE's; poor posture and alignment; high risk for falls per Berg Balance Score. Patient will benefit form PT to address problems identified.    Clinical Presentation Stable   Clinical Decision Making Low   Rehab Potential Good   PT Frequency 2x / week   PT Duration 6 weeks   PT Treatment/Interventions Patient/family education;ADLs/Self Care Home Management;Neuromuscular re-education;Cryotherapy;Electrical Stimulation;Iontophoresis 4mg /ml Dexamethasone;Moist Heat;Ultrasound;Dry needling;Manual techniques;Therapeutic activities;Therapeutic exercise;Balance training;Functional mobility training;Gait training;Stair training   PT Next Visit Plan postural correction; strengthening; balance and gait activities - modalities at address LBP as indicated    Consulted and Agree with Plan of Care Patient      Patient will benefit from skilled therapeutic intervention in order to improve the following deficits and impairments:  Postural dysfunction, Improper body  mechanics, Decreased range of motion, Decreased mobility, Decreased strength, Increased fascial restricitons, Pain, Decreased activity tolerance  Visit Diagnosis: Other symptoms and signs involving the musculoskeletal system - Plan: PT plan of care cert/re-cert  Unsteadiness on feet - Plan: PT plan of care cert/re-cert  Muscle weakness (generalized) - Plan: PT plan of care cert/re-cert  Acute bilateral low back pain without sciatica - Plan: PT plan of care cert/re-cert      [facility]COD[facility] - 10/12/16 1708    Functional Assessment Tool Used  Evaluation; FOTO; clinical assessment    Functional Limitations Mobility: Walking and moving around   Mobility: Walking and Moving Around Current Status At least 60 percent but less than 80 percent impaired, limited or restricted   Mobility: Walking and Moving Around Goal Status [phone]([id number]) At least 40 percent but less than 60 percent impaired, limited or restricted       Problem List Patient Active Problem List   Diagnosis Date Noted  . Cervical paraspinal muscle spasm 05/17/2016  . Memory loss 06/02/2015  . Lumbar degenerative disc disease 05/10/2013    Neena Beecham Rober MinionP Jaleeyah Munce PT, MPH  10/12/2016, 5:11 PM  [facility] [address] [place] [address] [place], [place], [address] Phone: [phone]   Fax:  [phone]  Name: Laural RoesCharles Ehinger MRN: [id number] Date of Birth: 10/09/1924

## 2016-10-12 NOTE — Patient Instructions (Signed)
Functional Quadriceps: Sit to Stand    Sit on edge of chair, feet flat on floor. Stand upright, extending knees fully. Sit slowly - no hands  Repeat __10__ times per set. Do __1-2__ sets per session. Do _2___ sessions per day.

## 2016-10-14 ENCOUNTER — Encounter: Payer: Self-pay | Admitting: Rehabilitative and Restorative Service Providers"

## 2016-10-14 ENCOUNTER — Ambulatory Visit (INDEPENDENT_AMBULATORY_CARE_PROVIDER_SITE_OTHER): Payer: Medicare HMO | Admitting: Rehabilitative and Restorative Service Providers"

## 2016-10-14 DIAGNOSIS — R2681 Unsteadiness on feet: Secondary | ICD-10-CM

## 2016-10-14 DIAGNOSIS — M545 Low back pain, unspecified: Secondary | ICD-10-CM

## 2016-10-14 DIAGNOSIS — M6281 Muscle weakness (generalized): Secondary | ICD-10-CM | POA: Diagnosis not present

## 2016-10-14 DIAGNOSIS — R29898 Other symptoms and signs involving the musculoskeletal system: Secondary | ICD-10-CM | POA: Diagnosis not present

## 2016-10-14 NOTE — Therapy (Signed)
[facility] [place] [address] [place], [place], [address] Phone: [phone]   Fax:  [phone]  Physical Therapy Treatment  Patient Details  Name: Juan Carrillo MRN: [id number] Date of Birth: 01-25-1925 Referring Provider: Dr Charmayne Sheer   Encounter Date: 10/14/2016      PT End of Session - 10/14/16 1457    Visit Number 2   Number of Visits 8   Date for PT Re-Evaluation 11/23/16   PT Start Time 1453  late due to traffic congestion    PT Stop Time 1529   PT Time Calculation (min) 36 min   Activity Tolerance Patient tolerated treatment well      Past Medical History:  Diagnosis Date  . Lumbar degenerative disc disease 05/10/2013  . Memory loss 06/02/2015    History reviewed. No pertinent surgical history.  There were no vitals filed for this visit.      Subjective Assessment - 10/14/16 1458    Subjective Charile reports that he does not have any LBP today. He is ready for exercising in PT    Currently in Pain? No/denies                         [facility] Adult PT Treatment/Exercise - 10/14/16 0001      Neuro Re-ed    Neuro Re-ed Details  standing encouraging extensioin of trunk; hips; knees - reaching overhead with extension multiple trials with verbal and tactile cues of PT      Exercises   Exercises --  gait with verbal and tactile cues of PT for posture ~ 80 ft     Lumbar Exercises: Stretches   Passive Hamstring Stretch 3 reps;30 seconds  supine Pt assisting with stretching    Passive Hamstring Stretch Limitations sitting with heel resting on floor for HS stretch 30 sec x 2 each side    Standing Extension Limitations working on standing with trunk; hips and knees extended      Lumbar Exercises: Aerobic   Stationary Bike Nustep L5 x 6 min      Lumbar Exercises: Standing   Other Standing Lumbar Exercises standing with good posture and alignmnet tightening gluts and extending into upright  position; standing working on reaching to side and overhead; standing tall to turn to look to one side then the other x 2 each side maintaining balance      Lumbar Exercises: Seated   Sit to Stand Limitations sit to stand with slow stand to sit x 10   sit to stand and reverse with no UE support      Lumbar Exercises: Sidelying   Other Sidelying Lumbar Exercises hip protraction in supported position assisted hip extension stretching hip flexors 20 sec x 5 - on each side                      PT Long Term Goals - 10/12/16 1705      PT LONG TERM GOAL #1   Title Patient to demonstrate upright posture and alignment with head over body and improved trunk; hip and knee extension 11/23/16   Time 6   Period Weeks   Status New     PT LONG TERM GOAL #2   Title Increase trunk and hip/knee extension allowing patient to maintain improved posture and turn head functionally 11/23/16    Time 6   Period Weeks   Status New     PT LONG TERM  GOAL #3   Title Improve Berg Balance Score by 5-8 points to decrease fall risk 11/23/16   Time 6   Period Weeks   Status New     PT LONG TERM GOAL #4   Title Independent in HEP with wife's assistance 11/23/16   Time 6   Period Weeks   Status New     PT LONG TERM GOAL #5   Title Decrease pain in the LB whe patient reports flare up of symptoms - as indicated 11/23/16   Time 6   Period Weeks   Status New               Plan - 10/14/16 1459    Clinical Impression Statement Billey GoslingCharlie works well in therapy. He tolerated exercise without difficulty - requiring breaks for rest periodically. Needs continued work to stretch hip flexors and improve hip extnesion and upright standing posture.    Rehab Potential Good   PT Frequency 2x / week   PT Duration 6 weeks   PT Treatment/Interventions Patient/family education;ADLs/Self Care Home Management;Neuromuscular re-education;Cryotherapy;Electrical Stimulation;Iontophoresis 4mg /ml Dexamethasone;Moist  Heat;Ultrasound;Dry needling;Manual techniques;Therapeutic activities;Therapeutic exercise;Balance training;Functional mobility training;Gait training;Stair training   PT Next Visit Plan postural correction; strengthening; balance and gait activities - modalities at address LBP as indicated    Consulted and Agree with Plan of Care Patient      Patient will benefit from skilled therapeutic intervention in order to improve the following deficits and impairments:  Postural dysfunction, Improper body mechanics, Decreased range of motion, Decreased mobility, Decreased strength, Increased fascial restricitons, Pain, Decreased activity tolerance  Visit Diagnosis: Other symptoms and signs involving the musculoskeletal system  Unsteadiness on feet  Muscle weakness (generalized)  Acute bilateral low back pain without sciatica     Problem List Patient Active Problem List   Diagnosis Date Noted  . Cervical paraspinal muscle spasm 05/17/2016  . Memory loss 06/02/2015  . Lumbar degenerative disc disease 05/10/2013    Annaliesa Blann Rober MinionP Arnita Koons PT, MPH  10/14/2016, 3:35 PM  [facility] [address] [place], [place], [address] Phone: [phone]   Fax:  [phone]  Name: Juan Carrillo MRN: [id number] Date of Birth: 03/18/1925

## 2016-10-26 ENCOUNTER — Encounter: Payer: Medicare HMO | Admitting: Rehabilitative and Restorative Service Providers"

## 2016-10-28 ENCOUNTER — Encounter: Payer: Self-pay | Admitting: Rehabilitative and Restorative Service Providers"

## 2016-10-28 ENCOUNTER — Ambulatory Visit (INDEPENDENT_AMBULATORY_CARE_PROVIDER_SITE_OTHER): Payer: Medicare HMO | Admitting: Rehabilitative and Restorative Service Providers"

## 2016-10-28 DIAGNOSIS — M6281 Muscle weakness (generalized): Secondary | ICD-10-CM | POA: Diagnosis not present

## 2016-10-28 DIAGNOSIS — R29898 Other symptoms and signs involving the musculoskeletal system: Secondary | ICD-10-CM

## 2016-10-28 DIAGNOSIS — R2681 Unsteadiness on feet: Secondary | ICD-10-CM | POA: Diagnosis not present

## 2016-10-28 NOTE — Therapy (Signed)
[facility] [address] [place] [address] [place], [place], [address] Phone: [phone]   Fax:  [phone]  Physical Therapy Treatment  Patient Details  Name: Juan RoesCharles Carrillo MRN: [id number] Date of Birth: 09/06/1924 Referring Provider: Dr Charmayne SheerEric Moser   Encounter Date: 10/28/2016      PT End of Session - 10/28/16 1412    Visit Number 3   Number of Visits 8   Date for PT Re-Evaluation 11/23/16   PT Start Time 1403   PT Stop Time 1449   PT Time Calculation (min) 46 min   Activity Tolerance Patient tolerated treatment well      Past Medical History:  Diagnosis Date  . Lumbar degenerative disc disease 05/10/2013  . Memory loss 06/02/2015    History reviewed. No pertinent surgical history.  There were no vitals filed for this visit.      Subjective Assessment - 10/28/16 1413    Subjective No pain - pretty good day. has a skinned spot on arm from dog(covered with bandaid in clinic)    Currently in Pain? No/denies                         [facility] Treatment/Exercise - 10/28/16 0001      Lumbar Exercises: Standing   Other Standing Lumbar Exercises standing with good posture and alignment tightening gluts and extending into upright position; standing working on reaching to side and overhead; standing tall to turn to look to one side then the other x 2 each side maintaining balance; reaching up shoulder flexion bilat x 10; arms to side reaching across body to clap hands in front     Lumbar Exercises: Seated   Sit to Stand Limitations sit to stand with slow stand to sit x 10   sit to stand and reverse with no UE support      Lumbar Exercises: Supine   Bridge 10 reps;5 seconds   Other Supine Lumbar Exercises trunk rotation in supine x 5 each side      Lumbar Exercises: Sidelying   Other Sidelying Lumbar Exercises hip protraction in supported position assisted hip extension stretching hip flexors 20 sec x 5 - on  each side PT assist   Other Sidelying Lumbar Exercises trunk rotation upper body fwd and back with hips stabilized by PT to stretch hip flexors and introduce trunk and hip rotation - working on Rt and Lt side                      PT Long Term Goals - 10/28/16 1413      PT LONG TERM GOAL #1   Title Patient to demonstrate upright posture and alignment with head over body and improved trunk; hip and knee extension 11/23/16   Time 6   Period Weeks   Status On-going     PT LONG TERM GOAL #2   Title Increase trunk and hip/knee extension allowing patient to maintain improved posture and turn head functionally 11/23/16    Time 6   Period Weeks   Status On-going     PT LONG TERM GOAL #3   Title Improve Berg Balance Score by 5-8 points to decrease fall risk 11/23/16   Time 6   Period Weeks   Status On-going     PT LONG TERM GOAL #4   Title Independent in HEP with wife's assistance 11/23/16   Time 6   Period Weeks  Status On-going     PT LONG TERM GOAL #5   Title Decrease pain in the LB whe patient reports flare up of symptoms - as indicated 11/23/16   Time 6   Period Weeks   Status On-going               Plan - 10/28/16 1426    Clinical Impression Statement 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 Demonstrates improved trunk rotatioin with work by PT and pt. Good rotation of trunk with protraction of hips in sidelying. Working to improve trunk and hip extension. Demonstrates improving upright posture.    Rehab Potential Good   PT Frequency 2x / week   PT Duration 6 weeks   PT Treatment/Interventions Patient/family education;ADLs/Self Care Home Management;Neuromuscular re-education;Cryotherapy;Electrical Stimulation;Iontophoresis 4mg /ml Dexamethasone;Moist Heat;Ultrasound;Dry needling;Manual techniques;Therapeutic activities;Therapeutic exercise;Balance training;Functional mobility training;Gait training;Stair training   PT Next Visit Plan postural correction;  strengthening; balance and gait activities - modalities at address LBP as indicated    Consulted and Agree with Plan of Care Patient      Patient will benefit from skilled therapeutic intervention in order to improve the following deficits and impairments:  Postural dysfunction, Improper body mechanics, Decreased range of motion, Decreased mobility, Decreased strength, Increased fascial restricitons, Pain, Decreased activity tolerance  Visit Diagnosis: Other symptoms and signs involving the musculoskeletal system  Unsteadiness on feet  Muscle weakness (generalized)     Problem List Patient Active Problem List   Diagnosis Date Noted  . Cervical paraspinal muscle spasm 05/17/2016  . Memory loss 06/02/2015  . Lumbar degenerative disc disease 05/10/2013    Lanelle Lindo Rober MinionP Taden Witter PT, MPH  10/28/2016, 2:44 PM  [facility] [address] [place] [place], [place], [address] Phone: [phone]   Fax:  [phone]  Name: Juan Carrillo MRN: [id number] Date of Birth: 01/31/1925

## 2016-10-31 ENCOUNTER — Ambulatory Visit (INDEPENDENT_AMBULATORY_CARE_PROVIDER_SITE_OTHER): Payer: Medicare HMO | Admitting: Rehabilitative and Restorative Service Providers"

## 2016-10-31 ENCOUNTER — Encounter: Payer: Self-pay | Admitting: Rehabilitative and Restorative Service Providers"

## 2016-10-31 DIAGNOSIS — M545 Low back pain, unspecified: Secondary | ICD-10-CM

## 2016-10-31 DIAGNOSIS — R2681 Unsteadiness on feet: Secondary | ICD-10-CM | POA: Diagnosis not present

## 2016-10-31 DIAGNOSIS — R29898 Other symptoms and signs involving the musculoskeletal system: Secondary | ICD-10-CM | POA: Diagnosis not present

## 2016-10-31 DIAGNOSIS — M6281 Muscle weakness (generalized): Secondary | ICD-10-CM

## 2016-10-31 NOTE — Therapy (Signed)
[facility] [address] [place], [place], [address] Phone: [phone]   Fax:  [phone]  Physical Therapy Treatment  Patient Details  Name: Juan RoesCharles Buffin MRN: [id number] Date of Birth: 01/03/1925 Referring Provider: Dr Charmayne SheerEric Moser   Encounter Date: 10/31/2016      PT End of Session - 10/31/16 1432    Visit Number 4   Number of Visits 8   Date for PT Re-Evaluation 11/23/16   PT Start Time 1430   PT Stop Time 1515   PT Time Calculation (min) 45 min   Activity Tolerance Patient tolerated treatment well      Past Medical History:  Diagnosis Date  . Lumbar degenerative disc disease 05/10/2013  . Memory loss 06/02/2015    History reviewed. No pertinent surgical history.  There were no vitals filed for this visit.      Subjective Assessment - 10/31/16 1433    Subjective Feels pretty good today.    Currently in Pain? No/denies            [facility] Assessment - 10/31/16 0001      Assessment   Medical Diagnosis LBP    Referring Provider Dr Charmayne SheerEric Moser    Onset Date/Surgical Date 09/09/16   Hand Dominance Right   Next MD Visit 8/18   Prior Therapy yes      Posture/Postural Control   Posture Comments some improvement noted in head forward; shoulders rounded and elevated; incresaed thoracic kyphosis; forward flexed through trunk and hips; knees flexed      Strength   Overall Strength Comments strength grossly 4+/5 to 5-/5 bilat LE's hip ext 4-/5      Flexibility   Hamstrings tight Rt 75 deg; Lt 72 deg   Quadriceps tight Rt 104 deg; Lt 108 deg      Palpation   Palpation comment decreased muscular tightness bilat lumbar paraspinals      Transfers   Comments rolling independently to change positions and come to sit      Ambulation/Gait   Gait Comments ambulates with SPC Rt - improved gait with less flexed forward gait as noted above; improved stability in standing and walking                       [facility] Adult PT Treatment/Exercise - 10/31/16 0001      Lumbar Exercises: Aerobic   Stationary Bike Nustep L5 x 6 min   arms(11) and legs      Lumbar Exercises: Standing   Other Standing Lumbar Exercises standing with good posture and alignment tightening gluts and extending into upright position; standing working on reaching to side and overhead; standing tall to turn to look to one side then the other x 2 each side maintaining balance; reaching up shoulder flexion bilat x 10; arms to side reaching across body to clap hands in front     Lumbar Exercises: Seated   Sit to Stand Limitations sit to stand with slow stand to sit x 10   sit to stand and reverse with no UE support      Lumbar Exercises: Supine   Bridge 10 reps;5 seconds   Other Supine Lumbar Exercises trunk rotation in supine x 5 each side      Lumbar Exercises: Sidelying   Other Sidelying Lumbar Exercises hip protraction in supported position assisted hip extension stretching hip flexors 20 sec x 5 - on each side PT assist   Other Sidelying Lumbar  Exercises trunk rotation upper body fwd and back with hips stabilized by PT to stretch hip flexors and introduce trunk and hip rotation - working on Rt and Lt side      Manual Therapy   Manual therapy comments pt sidelying    Passive ROM PROM and stretch for hip flexors with LE resting on bolster - PT stretching and doing some relaxation and releaes techniques through the posterior hip stretching hip flexors                      PT Long Term Goals - 10/31/16 1433      PT LONG TERM GOAL #1   Title Patient to demonstrate upright posture and alignment with head over body and improved trunk; hip and knee extension 11/23/16   Time 6   Period Weeks   Status On-going     PT LONG TERM GOAL #2   Title Increase trunk and hip/knee extension allowing patient to maintain improved posture and turn head functionally 11/23/16    Time 6   Period  Weeks   Status On-going     PT LONG TERM GOAL #3   Title Improve Berg Balance Score by 5-8 points to decrease fall risk 11/23/16   Time 6   Period Weeks   Status On-going     PT LONG TERM GOAL #4   Title Independent in HEP with wife's assistance 11/23/16   Time 6   Period Weeks   Status On-going     PT LONG TERM GOAL #5   Title Decrease pain in the LB whe patient reports flare up of symptoms - as indicated 11/23/16   Time 6   Period Weeks   Status On-going               Plan - 10/31/16 1438    Clinical Impression Statement Good improvement in LBP - [name] has only intermittent pain per wife's report. He continues to work hard in therapy - performing all requested tasks. Note increased endurance with exercise with fewer rest breaks. [name] is now independent in transfers sit to stand and stand to sit. He is ambulating with greater confidence and more upright posture. Progressing well toward stated goals of therapy.    Rehab Potential Good   PT Frequency 2x / week   PT Duration 6 weeks   PT Treatment/Interventions Patient/family education;ADLs/Self Care Home Management;Neuromuscular re-education;Cryotherapy;Electrical Stimulation;Iontophoresis 4mg /ml Dexamethasone;Moist Heat;Ultrasound;Dry needling;Manual techniques;Therapeutic activities;Therapeutic exercise;Balance training;Functional mobility training;Gait training;Stair training   PT Next Visit Plan postural correction; strengthening; balance and gait activities - modalities at address LBP as indicated    Consulted and Agree with Plan of Care Patient      Patient will benefit from skilled therapeutic intervention in order to improve the following deficits and impairments:  Postural dysfunction, Improper body mechanics, Decreased range of motion, Decreased mobility, Decreased strength, Increased fascial restricitons, Pain, Decreased activity tolerance  Visit Diagnosis: Other symptoms and signs involving the musculoskeletal  system  Unsteadiness on feet  Muscle weakness (generalized)  Acute bilateral low back pain without sciatica     Problem List Patient Active Problem List   Diagnosis Date Noted  . Cervical paraspinal muscle spasm 05/17/2016  . Memory loss 06/02/2015  . Lumbar degenerative disc disease 05/10/2013    Madie Cahn Rober Minion PT, MPH  10/31/2016, 3:18 PM  [facility] [address] [place], [place], [address] Phone: [phone]   Fax:  [phone]  Name: Meng  Cutting MRN: [id number] Date of Birth: 24-May-1924

## 2016-11-03 ENCOUNTER — Ambulatory Visit (INDEPENDENT_AMBULATORY_CARE_PROVIDER_SITE_OTHER): Payer: Medicare HMO | Admitting: Rehabilitative and Restorative Service Providers"

## 2016-11-03 ENCOUNTER — Encounter: Payer: Self-pay | Admitting: Rehabilitative and Restorative Service Providers"

## 2016-11-03 DIAGNOSIS — M6281 Muscle weakness (generalized): Secondary | ICD-10-CM | POA: Diagnosis not present

## 2016-11-03 DIAGNOSIS — M545 Low back pain, unspecified: Secondary | ICD-10-CM

## 2016-11-03 DIAGNOSIS — R2681 Unsteadiness on feet: Secondary | ICD-10-CM | POA: Diagnosis not present

## 2016-11-03 DIAGNOSIS — R29898 Other symptoms and signs involving the musculoskeletal system: Secondary | ICD-10-CM

## 2016-11-03 NOTE — Therapy (Signed)
[facility] [address] [place], [place], [address] Phone: [phone]   Fax:  [phone]  Physical Therapy Treatment  Patient Details  Name: Juan RoesCharles Carrillo MRN: [id number] Date of Birth: 11/10/1924 Referring Provider: Dr Charmayne SheerEric Moser   Encounter Date: 11/03/2016      PT End of Session - 11/03/16 1450    Visit Number 5   Number of Visits 8   Date for PT Re-Evaluation 11/23/16   PT Start Time 1443   PT Stop Time 1530   PT Time Calculation (min) 47 min   Activity Tolerance Patient tolerated treatment well      Past Medical History:  Diagnosis Date  . Lumbar degenerative disc disease 05/10/2013  . Memory loss 06/02/2015    History reviewed. No pertinent surgical history.  There were no vitals filed for this visit.      Subjective Assessment - 11/03/16 1452    Subjective No back pain today. Patient's wife reports that she can tell he is standing taller and straighter. He can get in and out of chairs by himself and is dressing independently now.    Currently in Pain? No/denies                         [facility] Adult PT Treatment/Exercise - 11/03/16 0001      Self-Care   Self-Care --  working on transfers and rolling side to back to stomach      Therapeutic Activites    Therapeutic Activities --  transfers sit to stand      Lumbar Exercises: Aerobic   Stationary Bike Nustep L5 x 6 min   arms(11) and legs      Lumbar Exercises: Standing   Other Standing Lumbar Exercises SLS on each leg x 3 sec with UE support on airex pad; step ups fwd x 20 each LE/lateral step ups x 10 each LE with bilat UE support      Other Standing Lumbar Exercises standing with good posture and alignment tightening gluts and extending into upright position; standing working on reaching to side and overhead; standing tall to turn to look to one side then the other x 2 each side maintaining balance; reaching up shoulder flexion  bilat x 10; arms to side reaching across body to clap hands in front     Lumbar Exercises: Seated   Sit to Stand Limitations sit to stand with slow stand to sit x 10   sit to stand and reverse with no UE support      Lumbar Exercises: Supine   Bridge 10 reps;5 seconds   Other Supine Lumbar Exercises trunk rotation in supine x 5 each side      Lumbar Exercises: Sidelying   Other Sidelying Lumbar Exercises hip protraction in supported position assisted hip extension stretching hip flexors 20 sec x 5 - on each side PT assist   Other Sidelying Lumbar Exercises trunk rotation upper body fwd and back with hips stabilized by PT to stretch hip flexors and introduce trunk and hip rotation - working on Rt and Lt side      Lumbar Exercises: Prone   Other Prone Lumbar Exercises glut set straightening knee with toe in wt bearing alt LE's; hip extension with knee flexed x 10     Manual Therapy   Manual therapy comments pt sidelying    Passive ROM PROM and stretch for hip flexors with LE resting on bolster - PT stretching  with relaxation and release techniques through the posterior hip stretching hip flexors                      PT Long Term Goals - 10/31/16 1433      PT LONG TERM GOAL #1   Title Patient to demonstrate upright posture and alignment with head over body and improved trunk; hip and knee extension 11/23/16   Time 6   Period Weeks   Status On-going     PT LONG TERM GOAL #2   Title Increase trunk and hip/knee extension allowing patient to maintain improved posture and turn head functionally 11/23/16    Time 6   Period Weeks   Status On-going     PT LONG TERM GOAL #3   Title Improve Berg Balance Score by 5-8 points to decrease fall risk 11/23/16   Time 6   Period Weeks   Status On-going     PT LONG TERM GOAL #4   Title Independent in HEP with wife's assistance 11/23/16   Time 6   Period Weeks   Status On-going     PT LONG TERM GOAL #5   Title Decrease pain in the  LB whe patient reports flare up of symptoms - as indicated 11/23/16   Time 6   Period Weeks   Status On-going               Plan - 11/03/16 1450    Clinical Impression Statement Cotninued improvement in standing posture and alignment with patient demonstrating improved trunk, hip and knee extension He has greater ease with moving from sit to stand. Cotninues to progress well toward improved independence and function.    Rehab Potential Good   PT Frequency 2x / week   PT Duration 6 weeks   PT Treatment/Interventions Patient/family education;ADLs/Self Care Home Management;Neuromuscular re-education;Cryotherapy;Electrical Stimulation;Iontophoresis 4mg /ml Dexamethasone;Moist Heat;Ultrasound;Dry needling;Manual techniques;Therapeutic activities;Therapeutic exercise;Balance training;Functional mobility training;Gait training;Stair training   PT Next Visit Plan postural correction; strengthening; balance and gait activities - modalities at address LBP as indicated    Consulted and Agree with Plan of Care Patient      Patient will benefit from skilled therapeutic intervention in order to improve the following deficits and impairments:  Postural dysfunction, Improper body mechanics, Decreased range of motion, Decreased mobility, Decreased strength, Increased fascial restricitons, Pain, Decreased activity tolerance  Visit Diagnosis: Other symptoms and signs involving the musculoskeletal system  Unsteadiness on feet  Muscle weakness (generalized)  Acute bilateral low back pain without sciatica     Problem List Patient Active Problem List   Diagnosis Date Noted  . Cervical paraspinal muscle spasm 05/17/2016  . Memory loss 06/02/2015  . Lumbar degenerative disc disease 05/10/2013    Cimberly Stoffel Rober Minion PT, MPH  11/03/2016, 3:26 PM  [facility] [address] [place], [place], [address] Phone: [phone]   Fax:   [phone]  Name: Juan Carrillo MRN: [id number] Date of Birth: [birth year]

## 2016-11-07 ENCOUNTER — Encounter: Payer: Medicare HMO | Admitting: Rehabilitative and Restorative Service Providers"

## 2016-11-10 ENCOUNTER — Encounter: Payer: Self-pay | Admitting: Rehabilitative and Restorative Service Providers"

## 2016-11-10 ENCOUNTER — Ambulatory Visit (INDEPENDENT_AMBULATORY_CARE_PROVIDER_SITE_OTHER): Payer: Medicare HMO | Admitting: Rehabilitative and Restorative Service Providers"

## 2016-11-10 DIAGNOSIS — M545 Low back pain, unspecified: Secondary | ICD-10-CM

## 2016-11-10 DIAGNOSIS — M6281 Muscle weakness (generalized): Secondary | ICD-10-CM | POA: Diagnosis not present

## 2016-11-10 DIAGNOSIS — R2681 Unsteadiness on feet: Secondary | ICD-10-CM

## 2016-11-10 DIAGNOSIS — R29898 Other symptoms and signs involving the musculoskeletal system: Secondary | ICD-10-CM

## 2016-11-10 NOTE — Therapy (Signed)
[facility] [place] [address] [place], [place], [address] Phone: [phone]   Fax:  [phone]  Physical Therapy Treatment  Patient Details  Name: Juan Carrillo MRN: [id number] Date of Birth: [birth year] Referring Provider: Dr Charmayne Sheer   Encounter Date: 11/10/2016      PT End of Session - 11/10/16 1447    Visit Number 6   Number of Visits 8   Date for PT Re-Evaluation 11/23/16   PT Start Time 1445   PT Stop Time 1528   PT Time Calculation (min) 43 min   Activity Tolerance Patient tolerated treatment well      Past Medical History:  Diagnosis Date  . Lumbar degenerative disc disease 05/10/2013  . Memory loss 06/02/2015    History reviewed. No pertinent surgical history.  There were no vitals filed for this visit.      Subjective Assessment - 11/10/16 1450    Subjective No back pain today.[name] can get in and out of the bed and chairs independently and is dressing himself independently.    Currently in Pain? No/denies                         [facility] Adult PT Treatment/Exercise - 11/10/16 0001      Self-Care   Self-Care --  working on transfers and rolling side to back to stomach      Therapeutic Activites    Therapeutic Activities --  transfers sit to stand      Lumbar Exercises: Aerobic   Stationary Bike Nustep L5 x 6 min arms(12) and legs  arms(11) and legs      Lumbar Exercises: Standing   Other Standing Lumbar Exercises SLS on each leg x 3 sec with UE support on airex pad; step ups fwd x 20 each LE/lateral step ups x 10 each LE with bilat UE support      Other Standing Lumbar Exercises standing with good posture and alignment tightening gluts and extending into upright position; standing working on reaching to side and overhead; standing tall to turn to look to one side then the other x 2 each side maintaining balance; reaching up shoulder flexion bilat x 10; arms to side reaching  across body to clap hands in front     Lumbar Exercises: Seated   Sit to Stand Limitations sit to stand with slow stand to sit x 10   sit to stand and reverse with no UE support      Lumbar Exercises: Supine   Bridge 10 reps;5 seconds   Other Supine Lumbar Exercises trunk rotation in supine x 5 each side      Lumbar Exercises: Sidelying   Other Sidelying Lumbar Exercises hip protraction in supported position assisted hip extension stretching hip flexors 20 sec x 5 - on each side PT assist   Other Sidelying Lumbar Exercises trunk rotation upper body fwd and back with hips stabilized by PT to stretch hip flexors and introduce trunk and hip rotation - working on Rt and Lt side                      PT Long Term Goals - 11/10/16 1448      PT LONG TERM GOAL #1   Title Patient to demonstrate upright posture and alignment with head over body and improved trunk; hip and knee extension 11/23/16   Time 6   Period Weeks   Status On-going  PT LONG TERM GOAL #2   Title Increase trunk and hip/knee extension allowing patient to maintain improved posture and turn head functionally 11/23/16    Time 6   Period Weeks   Status On-going     PT LONG TERM GOAL #3   Title Improve Berg Balance Score by 5-8 points to decrease fall risk 11/23/16   Time 6   Period Weeks   Status On-going     PT LONG TERM GOAL #4   Title Independent in HEP with wife's assistance 11/23/16   Time 6   Period Weeks   Status On-going     PT LONG TERM GOAL #5   Title Decrease pain in the LB whe patient reports flare up of symptoms - as indicated 11/23/16   Time 6   Period Weeks   Status Achieved               Plan - 11/10/16 1448    Clinical Impression Statement No LBP today and patient reports that he has less LBP overall. He continues to work on increasing his activitiy level at home and is getting in and out of the chair and bed independently. He works well in PT.    Rehab Potential Good   PT  Frequency 2x / week   PT Duration 6 weeks   PT Treatment/Interventions Patient/family education;ADLs/Self Care Home Management;Neuromuscular re-education;Cryotherapy;Electrical Stimulation;Iontophoresis 4mg /ml Dexamethasone;Moist Heat;Ultrasound;Dry needling;Manual techniques;Therapeutic activities;Therapeutic exercise;Balance training;Functional mobility training;Gait training;Stair training   PT Next Visit Plan postural correction; strengthening; balance and gait activities - modalities at address LBP as indicated    Consulted and Agree with Plan of Care Patient      Patient will benefit from skilled therapeutic intervention in order to improve the following deficits and impairments:  Postural dysfunction, Improper body mechanics, Decreased range of motion, Decreased mobility, Decreased strength, Increased fascial restricitons, Pain, Decreased activity tolerance  Visit Diagnosis: Other symptoms and signs involving the musculoskeletal system  Unsteadiness on feet  Muscle weakness (generalized)  Acute bilateral low back pain without sciatica     Problem List Patient Active Problem List   Diagnosis Date Noted  . Cervical paraspinal muscle spasm 05/17/2016  . Memory loss 06/02/2015  . Lumbar degenerative disc disease 05/10/2013    Celyn Rober MinionP Holt PT, MPH 11/10/2016, 3:30 PM  [facility] [address] [place], [place], [address] Phone: [phone]   Fax:  [phone]  Name: Laural RoesCharles Lombardo MRN: [id number] Date of Birth: 07/10/1924

## 2016-11-17 ENCOUNTER — Encounter: Payer: Self-pay | Admitting: Physical Therapy

## 2016-11-24 ENCOUNTER — Encounter: Payer: Self-pay | Admitting: Physical Therapy

## 2016-12-01 ENCOUNTER — Encounter: Payer: Self-pay | Admitting: Rehabilitative and Restorative Service Providers"

## 2016-12-01 ENCOUNTER — Ambulatory Visit (INDEPENDENT_AMBULATORY_CARE_PROVIDER_SITE_OTHER): Payer: Medicare HMO | Admitting: Rehabilitative and Restorative Service Providers"

## 2016-12-01 DIAGNOSIS — R29898 Other symptoms and signs involving the musculoskeletal system: Secondary | ICD-10-CM | POA: Diagnosis not present

## 2016-12-01 DIAGNOSIS — M6281 Muscle weakness (generalized): Secondary | ICD-10-CM

## 2016-12-01 DIAGNOSIS — R2681 Unsteadiness on feet: Secondary | ICD-10-CM | POA: Diagnosis not present

## 2016-12-01 NOTE — Therapy (Signed)
[place], [place], [address] Phone: [phone]   Fax:  [phone]  Physical Therapy Treatment  Patient Details  Name: Juan Carrillo MRN: [id number] Date of Birth: 26-Sep-1924 Referring Provider: Dr Creig Hines   Encounter Date: 12/01/2016      PT End of Session - 12/01/16 1406    Visit Number 7   Number of Visits 8   Date for PT Re-Evaluation 12/15/16   PT Start Time 6283   PT Stop Time 1445   PT Time Calculation (min) 41 min   Activity Tolerance Patient tolerated treatment well      Past Medical History:  Diagnosis Date  . Lumbar degenerative disc disease 05/10/2013  . Memory loss 06/02/2015    History reviewed. No pertinent surgical history.  There were no vitals filed for this visit.      Subjective Assessment - 12/01/16 1407    Subjective No pain. wife reports that Juan Carrillo is doing better at home. he is getting up and dressing each morning by himself and now wtaying up in the morning.    Currently in Pain? No/denies            [facility] PT Assessment - 12/01/16 0001      Assessment   Medical Diagnosis LBP    Referring Provider Dr Creig Hines    Onset Date/Surgical Date 09/09/16   Hand Dominance Right   Next MD Visit PRN    Prior Therapy yes      Posture/Postural Control   Posture Comments continued improvement noted in head forward; shoulders rounded and elevated; incresaed thoracic kyphosis; forward flexed through trunk and hips; knees flexed but patient continues to have forward posture - can correct with verbal cues      Strength   Overall Strength Comments strength grossly 4+/5 to 5-/5 bilat LE's hip ext 4-/5      Flexibility   Hamstrings tight Rt 76 deg; Lt 74 deg   Quadriceps tight Rt 105 deg; Lt 108 deg      Transfers   Comments rolling independently to change positions and come to sit; independent in sit to stand without UE support/assist     Ambulation/Gait    Gait Comments ambulates with SPC Rt - improved gait with less flexed forward gait as noted above; improved stability in standing and walking      Berg Balance Test   Sit to Stand Able to stand without using hands and stabilize independently   Standing Unsupported Able to stand safely 2 minutes   Sitting with Back Unsupported but Feet Supported on Floor or Stool Able to sit safely and securely 2 minutes   Stand to Sit Sits safely with minimal use of hands   Transfers Able to transfer safely, minor use of hands   Standing Unsupported with Eyes Closed Able to stand 10 seconds safely   Standing Ubsupported with Feet Together Able to place feet together independently and stand 1 minute safely   From Standing, Reach Forward with Outstretched Arm Can reach confidently >25 cm (10")   From Standing Position, Pick up Object from Floor Able to pick up shoe safely and easily   From Standing Position, Turn to Look Behind Over each Shoulder Looks behind from both sides and weight shifts well   Turn 360 Degrees Able to turn 360 degrees safely but slowly   Standing Unsupported, Alternately Place Feet on Step/Stool Able to complete >2 steps/needs minimal assist   Standing Unsupported,  One Foot in [employer] balance while stepping or standing   Standing on One Leg Tries to lift leg/unable to hold 3 seconds but remains standing independently   Total Score 44   Berg comment: significant risk for falling                      [facility] Adult PT Treatment/Exercise - 12/01/16 0001      Self-Care   Self-Care --  working on transfers and rolling side to back to stomach      Neuro Re-ed    Neuro Re-ed Details  standing encouraging extensioin of trunk; hips; knees - reaching overhead with extension multiple trials with verbal and tactile cues of PT   working on turning head side to side without UE assist     Lumbar Exercises: Aerobic   Stationary Bike Nustep L5 x 6 min arms(12) and legs  arms(11) and  legs      Lumbar Exercises: Standing   Other Standing Lumbar Exercises SLS on each leg x 3 sec with UE support on airex pad; step ups fwd x 20 each LE/lateral step ups x 10 each LE with bilat UE support      Other Standing Lumbar Exercises standing with good posture and alignment tightening gluts and extending into upright position; standing working on reaching to side and overhead; standing tall to turn to look to one side then the other x 2 each side maintaining balance; reaching up shoulder flexion bilat x 10; arms to side reaching across body to clap hands in front     Lumbar Exercises: Seated   Sit to Stand Limitations sit to stand with slow stand to sit x 10   sit to stand and reverse with no UE support      Lumbar Exercises: Supine   Bridge 10 reps;5 seconds   Other Supine Lumbar Exercises trunk rotation in supine x 5 each side      Lumbar Exercises: Sidelying   Other Sidelying Lumbar Exercises hip protraction in supported position assisted hip extension stretching hip flexors 20 sec x 5 - on each side PT assist     Manual Therapy   Manual therapy comments pt sidelying    Passive ROM PROM and stretch for hip flexors with LE resting on bolster - PT stretching with relaxation and release techniques through the posterior hip stretching hip flexors                      PT Long Term Goals - 12/01/16 1414      PT LONG TERM GOAL #1   Title Patient to demonstrate upright posture and alignment with head over body and improved trunk; hip and knee extension 12/15/16   Time 6   Status Partially Met     PT LONG TERM GOAL #2   Title Increase trunk and hip/knee extension allowing patient to maintain improved posture and turn head functionally 11/23/16    Status Achieved     PT LONG TERM GOAL #3   Title Improve Berg Balance Score by 5-8 points to decrease fall risk 12/15/16   Time 6   Period Weeks   Status Partially Met     PT LONG TERM GOAL #4   Title Independent in HEP  with wife's assistance 12/15/16   Time 6   Period Weeks   Status Partially Met     PT LONG TERM GOAL #5   Title Decrease pain in the  LB whe patient reports flare up of symptoms - as indicated 11/23/16   Time 6   Period Weeks   Status Achieved               Plan - 12/01/16 1410    Clinical Impression Statement Iproing functional activity level. continues to have tightness through the hip flexors but upright posture is improving. Progressing well toward goals of therapy. Patient will transitioin to community based exercise program after next PT visit.    Rehab Potential Good   PT Frequency 1x / week   PT Duration 6 weeks   PT Treatment/Interventions Patient/family education;ADLs/Self Care Home Management;Neuromuscular re-education;Cryotherapy;Electrical Stimulation;Iontophoresis 108m/ml Dexamethasone;Moist Heat;Ultrasound;Dry needling;Manual techniques;Therapeutic activities;Therapeutic exercise;Balance training;Functional mobility training;Gait training;Stair training   PT Next Visit Plan postural correction; strengthening; balance and gait activities - modalities at address LBP as indicated anticipate d/c to community based program at next visit    Consulted and Agree with Plan of Care Patient      Patient will benefit from skilled therapeutic intervention in order to improve the following deficits and impairments:  Postural dysfunction, Improper body mechanics, Decreased range of motion, Decreased mobility, Decreased strength, Increased fascial restricitons, Pain, Decreased activity tolerance  Visit Diagnosis: Other symptoms and signs involving the musculoskeletal system - Plan: PT plan of care cert/re-cert  Unsteadiness on feet - Plan: PT plan of care cert/re-cert  Muscle weakness (generalized) - Plan: PT plan of care cert/re-cert     Problem List Patient Active Problem List   Diagnosis Date Noted  . Cervical paraspinal muscle spasm 05/17/2016  . Memory loss 06/02/2015   . Lumbar degenerative disc disease 05/10/2013    Juan Carrillo PNilda SimmerPT, MPH  12/01/2016, 2:43 PM  [facility] [address]Phone: [phone]  Fax:  [phone] Name: CMale MinishMRN: [id number] of Birth: 1Jan 16, 1926

## 2016-12-08 ENCOUNTER — Ambulatory Visit (INDEPENDENT_AMBULATORY_CARE_PROVIDER_SITE_OTHER): Payer: Medicare HMO | Admitting: Rehabilitative and Restorative Service Providers"

## 2016-12-08 ENCOUNTER — Encounter: Payer: Self-pay | Admitting: Rehabilitative and Restorative Service Providers"

## 2016-12-08 DIAGNOSIS — M6281 Muscle weakness (generalized): Secondary | ICD-10-CM

## 2016-12-08 DIAGNOSIS — R2681 Unsteadiness on feet: Secondary | ICD-10-CM

## 2016-12-08 DIAGNOSIS — R29898 Other symptoms and signs involving the musculoskeletal system: Secondary | ICD-10-CM | POA: Diagnosis not present

## 2016-12-08 DIAGNOSIS — M545 Low back pain, unspecified: Secondary | ICD-10-CM

## 2016-12-08 NOTE — Patient Instructions (Addendum)
SIT TO STAND: No Device    Sit with feet shoulder-width apart, on floor. Lean chest forward, raise hips up from surface. Straighten hips and knees. Weight bear equally on left and right sides. _10__ reps per set, _1__ sets per day, _5__ days per week Place left leg closer to sitting surface.  AMBULATION: Walk Backward    Walk backward. Take large steps, do not drag feet. _10__ reps per set, _2__ sets per day, _4-6__ days per week Use assistive device. Walk to tempo of metronome or music.   AMBULATION: Side Step    Step sideways. Repeat in opposite direction. _10__ reps per set, _2__ sets per day, _4-6__ days per week Use assistive device. Walk to tempo of metronome or music.  Anterior Step-Up    Holding onto rail: Stand with __6_ inch step placed in A direction. Step onto step with right foot without pushing off with the ground foot. Finish with ground foot in touch balance on step and return _10__ times. _1-2__ sets   Overhead    Standing at cabinets:  Reach for overhead targets with one arm, or both arms. _10_ reps per set, __1_ sets per day, _4__ days per week    Complete  1__ sets of _3__ repetitions. ABDUCTION: Standing (Active)    Stand, feet flat. Lift right leg out to side. Use _0__ lbs. Complete __1-2_ sets of _10__ repetitions. Perform _1__ sessions per day.   EXTENSION: Prone - Knee Extended (Active)    Lie on stomach, legs straight. Lift right leg toward ceiling. Use __0_ lbs. Complete __1-2_ sets of _10__ repetitions. Perform __1_ sessions per day.   Bridging: Supine Up/Down (Active)    Lie with knees bent, feet flat. Push hips up.  Complete _1-2__ sets of _10__ repetitions. Perform __1_ sessions per day.  Abduction: Clam (Eccentric) - Side-Lying    Lie on side with knees bent. Lift top knee, keeping feet together. Keep trunk steady. Slowly lower for 3-5 seconds. _10__ reps per set, _1__ sets per day,   EXTENSION: Side-Lying - Knee  Flexed: Powder Board (Active - Assistance)  (TO STRETCH FRONT OF HIP)    Lie on left side, board between legs. Therapist will Slide top, bent leg backward as far as possible. Hold 30 seconds Balance: One-Leg    Stand on left leg on Foot Triangle of Support. Use hand supports. Let go with left hand, then right hand. Support as little as possible but as much as needed. Stand _15__ seconds. Repeat, standing on right leg.  Think Taller    Stand 2-4 inches taller. Imagine a string, golden thread, rope, or steel cable pulling up at the crown of the head. Avoid tilting head back - keep chin parallel to floor. Do not shrug shoulders. Practice frequently during the day.   Copyright  V[employer]. All rights reserved.

## 2016-12-08 NOTE — Therapy (Addendum)
[place], [place], [address] Phone: [phone]   Fax:  [phone]  Physical Therapy Treatment  Patient Details  Name: Juan Carrillo MRN: [id number] Date of Birth: 10/31/1924 Referring Provider: Dr Creig Hines   Encounter Date: 12/08/2016      PT End of Session - 12/08/16 1421    Visit Number 8   Number of Visits 8   Date for PT Re-Evaluation 12/15/16   PT Start Time 6433   PT Stop Time 1446   PT Time Calculation (min) 42 min   Activity Tolerance Patient tolerated treatment well      Past Medical History:  Diagnosis Date  . Lumbar degenerative disc disease 05/10/2013  . Memory loss 06/02/2015    History reviewed. No pertinent surgical history.  There were no vitals filed for this visit.      Subjective Assessment - 12/08/16 1416    Subjective No pain. wife reports that Juan Carrillo is doing better at home. he is getting up and dressing each morning by himself and now staying up in the morning, not going back to bed. Activity level has improved with increased endurance for functional activities. Wife is working on follow up exercise at a community center. .    Currently in Pain? No/denies            [facility] PT Assessment - 12/08/16 0001      Assessment   Medical Diagnosis LBP    Referring Provider Dr Creig Hines    Onset Date/Surgical Date 09/09/16   Hand Dominance Right   Next MD Visit PRN    Prior Therapy yes      Posture/Postural Control   Posture Comments continued improvement noted in head forward; shoulders rounded and elevated; incresaed thoracic kyphosis; forward flexed through trunk and hips; knees flexed but patient continues to have forward posture - can correct with verbal cues      Strength   Overall Strength Comments strength grossly 4+/5 to 5-/5 to 5-/5 bilat LE's hip ext 4-/5      Flexibility   Hamstrings tight Rt 77 deg; Lt 74 deg   Quadriceps tight Rt 105 deg; Lt  108 deg      Palpation   Palpation comment WFL's bilat lumbar paraspinals      Transfers   Comments rolling independently to change positions and come to sit; independent in sit to stand without UE support/assist     Ambulation/Gait   Gait Comments ambulates with SPC Rt - improved gait with less flexed forward gait as noted above; improved stability in standing and walking      Berg Balance Test   Sit to Stand Able to stand without using hands and stabilize independently   Standing Unsupported Able to stand safely 2 minutes   Sitting with Back Unsupported but Feet Supported on Floor or Stool Able to sit safely and securely 2 minutes   Stand to Sit Sits safely with minimal use of hands   Transfers Able to transfer safely, minor use of hands   Standing Unsupported with Eyes Closed Able to stand 10 seconds safely   Standing Ubsupported with Feet Together Able to place feet together independently and stand 1 minute safely   From Standing, Reach Forward with Outstretched Arm Can reach confidently >25 cm (10")   From Standing Position, Pick up Object from Floor Able to pick up shoe safely and easily   From Standing Position, Turn to Look Behind Over each  Shoulder Looks behind from both sides and weight shifts well   Turn 360 Degrees Able to turn 360 degrees safely but slowly   Standing Unsupported, Alternately Place Feet on Step/Stool Able to complete >2 steps/needs minimal assist   Standing Unsupported, One Foot in [employer] balance while stepping or standing   Standing on One Leg Tries to lift leg/unable to hold 3 seconds but remains standing independently   Total Score 44   Berg comment: significant risk for falling                      [facility] Adult PT Treatment/Exercise - 12/08/16 0001      Self-Care   Self-Care --  working on transfers and rolling side to back to stomach      Neuro Re-ed    Neuro Re-ed Details  standing encouraging extensioin of trunk; hips; knees -  reaching overhead with extension multiple trials with verbal and tactile cues of PT   working on turning head side to side without UE assist     Lumbar Exercises: Aerobic   Stationary Bike Nustep L6 x 7 min arms(12) and legs  arms(11) and legs      Lumbar Exercises: Standing   Other Standing Lumbar Exercises SLS on each leg x 3 sec with UE support on airex pad; step ups fwd x 20 each LE/lateral step ups x 10 each LE with bilat UE support      Other Standing Lumbar Exercises standing with good posture and alignment tightening gluts and extending into upright position; standing working on reaching to side and overhead; standing tall to turn to look to one side then the other x 2 each side maintaining balance; reaching up shoulder flexion bilat x 10; arms to side reaching across body to clap hands in front     Lumbar Exercises: Seated   Sit to Stand Limitations sit to stand with slow stand to sit x 10   sit to stand and reverse with no UE support      Lumbar Exercises: Sidelying   Hip Abduction 10 reps  PT cues and assist to encourage as much hip ext as possible   Other Sidelying Lumbar Exercises hip protraction in supported position assisted hip extension stretching hip flexors 20 sec x 5 - on each side PT assist     Manual Therapy   Manual therapy comments pt sidelying    Passive ROM PROM and stretch for hip flexors with LE resting on bolster - PT stretching with relaxation and release techniques through the posterior hip stretching hip flexors                 PT Education - 12/08/16 1455    Education provided Yes   Education Details HEP   Person(s) Educated Patient;Spouse   Methods Explanation;Demonstration;Tactile cues;Verbal cues;Handout   Comprehension Verbalized understanding;Returned demonstration;Verbal cues required;Tactile cues required             PT Long Term Goals - 12/08/16 1440      PT LONG TERM GOAL #1   Title Patient to demonstrate upright posture and  alignment with head over body and improved trunk; hip and knee extension 12/15/16   Time 6   Period Weeks   Status Partially Met     PT LONG TERM GOAL #2   Title Increase trunk and hip/knee extension allowing patient to maintain improved posture and turn head functionally 11/23/16    Time 6   Period  Weeks   Status Achieved     PT LONG TERM GOAL #3   Title Improve Berg Balance Score by 5-8 points to decrease fall risk 12/15/16   Time 6   Period Weeks   Status Achieved  6 point improvement      PT LONG TERM GOAL #4   Title Independent in HEP with wife's assistance 12/15/16   Time 6   Period Weeks   Status Partially Met  patient to participate in community based exercise program      PT LONG TERM GOAL #5   Title Decrease pain in the LB whe patient reports flare up of symptoms - as indicated 11/23/16   Time 6   Period Weeks   Status Achieved               Plan - 12-18-16 1421    Clinical Impression Statement Juan Carrillo continues to demonstrate improvement in functional activity level with independent transfers and transitional movements. Gait is improved with more upright posture and alignment. Juan Carrillo continues to have forward flexed posture with flexed trunk and hips but both have improved. This is an area that needs continued work. Berg balance Score has improved since initiat evaluation reflecting decreased risk of falling. Juan Carrillo demonstrates increased LE strength and function. Goals of therapy are partially accomplished. Juan Carrillo will benefit from continued supervised exercise. He will work hard when he is supervised and directed but is not independent in HEP and his wife is unable to assit with exercise at home. She has a community based program that Juan Carrillo will continue with at d/c from PT.    Rehab Potential Good   PT Frequency 1x / week   PT Duration 6 weeks   PT Treatment/Interventions Patient/family education;ADLs/Self Care Home Management;Neuromuscular  re-education;Cryotherapy;Electrical Stimulation;Iontophoresis 54m/ml Dexamethasone;Moist Heat;Ultrasound;Dry needling;Manual techniques;Therapeutic activities;Therapeutic exercise;Balance training;Functional mobility training;Gait training;Stair training   PT Next Visit Plan d/c to community based exercise program    Consulted and Agree with Plan of Care Patient;Family member/caregiver   Family Member Consulted wife       Patient will benefit from skilled therapeutic intervention in order to improve the following deficits and impairments:  Postural dysfunction, Improper body mechanics, Decreased range of motion, Decreased mobility, Decreased strength, Increased fascial restricitons, Pain, Decreased activity tolerance  Visit Diagnosis: Other symptoms and signs involving the musculoskeletal system  Unsteadiness on feet  Muscle weakness (generalized)  Acute bilateral low back pain without sciatica       [facility] PT PB G-CODES - [id number]    Functional Assessment Tool Used  Evaluation; FOTO; clinical assessment    Functional Limitations Mobility: Walking and moving around   Mobility: Walking and Moving Around Current Status At least 40 percent but less than 60 percent impaired, limited or restricted   Mobility: Walking and Moving Around Goal Status [phone] At least 40 percent but less than 60 percent impaired, limited or restricted   Mobility: Walking and Moving Around Discharge Status [phone] At least 40 percent but less than 60 percent impaired, limited or restricted      Problem List Patient Active Problem List   Diagnosis Date Noted  . Cervical paraspinal muscle spasm 05/17/2016  . Memory loss 06/02/2015  . Lumbar degenerative disc disease 05/10/2013    Celyn PNilda SimmerPT, MPH 923-Sep-2018 5:37 PM  [facility] [address]Phone: [phone]  Fax:  [phone] Name: Juan WredeMRN:  [id number] of Birth: [birth year]  PHYSICAL THERAPY DISCHARGE SUMMARY  Visits from Start of Care: 8  Current functional level related to goals / functional outcomes: See progress note for discharge status   Remaining deficits: Will benefit from continued supervised exercise    Education / Equipment: HEP  Plan: Patient agrees to discharge.  Patient goals were partially met. Patient is being discharged due to being pleased with the current functional level.  ?????    Celyn P. Helene Kelp PT, MPH 12/08/16 5:37 PM

## 2017-04-25 ENCOUNTER — Ambulatory Visit (INDEPENDENT_AMBULATORY_CARE_PROVIDER_SITE_OTHER): Payer: Medicare HMO

## 2017-04-25 ENCOUNTER — Ambulatory Visit (INDEPENDENT_AMBULATORY_CARE_PROVIDER_SITE_OTHER): Payer: Medicare HMO | Admitting: Sports Medicine

## 2017-04-25 DIAGNOSIS — M25511 Pain in right shoulder: Secondary | ICD-10-CM

## 2017-04-25 DIAGNOSIS — M19011 Primary osteoarthritis, right shoulder: Secondary | ICD-10-CM

## 2017-04-25 NOTE — Progress Notes (Signed)
Subjective:    [name] seeing this patient as a consultation for:  Dr. Dewain Penningavid Carrillo  CC: Right shoulder pain  HPI: This is a pleasant 84 year old male, history of dementia, several months to years of right shoulder pain, localized over the anterior and posterior joint lines with severe loss of range of motion.  Pain is severe, persistent.  Localized.  No radiation.  Ibuprofen has only been minimally efficacious.  I reviewed the past medical history, family history, social history, surgical history, and allergies today and no changes were needed.  Please see the problem list section below in epic for further details.  Past Medical History: Past Medical History:  Diagnosis Date  . Lumbar degenerative disc disease 05/10/2013  . Memory loss 06/02/2015   Past Surgical History: No past surgical history on file. Social History: Social History   Socioeconomic History  . Marital status: Married    Spouse name: Not on file  . Number of children: Not on file  . Years of education: Not on file  . Highest education level: Not on file  Social Needs  . Financial resource strain: Not on file  . Food insecurity - worry: Not on file  . Food insecurity - inability: Not on file  . Transportation needs - medical: Not on file  . Transportation needs - non-medical: Not on file  Occupational History  . Not on file  Tobacco Use  . Smoking status: Never Smoker  Substance and Sexual Activity  . Alcohol use: Not on file  . Drug use: Not on file  . Sexual activity: Not on file  Other Topics Concern  . Not on file  Social History Narrative  . Not on file   Family History: No family history on file. Allergies: No Known Allergies Medications: See med rec.  Review of Systems: No headache, visual changes, nausea, vomiting, diarrhea, constipation, dizziness, abdominal pain, skin rash, fevers, chills, night sweats, weight loss, swollen lymph nodes, body aches, joint swelling, muscle aches, chest pain,  shortness of breath, mood changes, visual or auditory hallucinations.   Objective:   General: Well Developed, well nourished, and in no acute distress.  Neuro:  Extra-ocular muscles intact, able to move all 4 extremities, sensation grossly intact.  Deep tendon reflexes tested were normal. Psych: Alert and oriented, mood congruent with affect. ENT:  Ears and nose appear unremarkable.  Hearing grossly normal. Neck: Unremarkable overall appearance, trachea midline.  No visible thyroid enlargement. Eyes: Conjunctivae and lids appear unremarkable.  Pupils equal and round. Skin: Warm and dry, no rashes noted.  Cardiovascular: Pulses palpable, no extremity edema. Right shoulder: Crepitus with range of motion, severe pain with abduction and external rotation.  Positive drop arm sign, unable to abduct past 0 degrees without help.  Procedure: Real-time Ultrasound Guided Injection of right glenohumeral joint Device: GE Logiq E  Verbal informed consent obtained.  Time-out conducted.  Noted no overlying erythema, induration, or other signs of local infection.  Skin prepped in a sterile fashion.  Local anesthesia: Topical Ethyl chloride.  With sterile technique and under real time ultrasound guidance: Noted severe osteoarthritis, using a 22-gauge spinal needle advanced into the [facility] from a posterior approach and injected 1 cc kenalog 40, 2 cc lidocaine, 2 cc bupivacaine. Completed without difficulty  Pain immediately resolved suggesting accurate placement of the medication.  Advised to call if fevers/chills, erythema, induration, drainage, or persistent bleeding.  Images permanently stored and available for review in the ultrasound unit.  Impression: Technically  successful ultrasound guided injection.  Impression and Recommendations:   This case required medical decision making of moderate complexity.  Primary osteoarthritis of right shoulder Glenohumeral injection. X-rays, home  health physical therapy. Return to see me in 1 month. ___________________________________________ Juan Carrillo. Juan Carrillo, M.D., ABFM., CAQSM. Primary Care and Sports Medicine [facility]  Adjunct Instructor of Family Medicine  [employer]

## 2017-04-25 NOTE — Assessment & Plan Note (Signed)
Glenohumeral injection. X-rays, home health physical therapy. Return to see me in 1 month.

## 2017-04-26 ENCOUNTER — Telehealth: Payer: Self-pay | Admitting: Sports Medicine

## 2017-04-26 NOTE — Telephone Encounter (Signed)
Pt's wife called to say an order was placed for home PT. [facility]. [facility] service because she "doesn't like how they operate." [facility]. Will route to referral coordinator for update.

## 2017-04-27 NOTE — Telephone Encounter (Signed)
I was the SOMEBODYYYYYYY because usually when Dr.T does the referral he puts the place in comments WITHOUT it being included in the Home Care instructions as it was, so I over looked it. My apologies :)  I have resent to [facility] and I will let patient know it was me that overlooked that and have sent

## 2017-04-27 NOTE — Telephone Encounter (Signed)
Routing to referral coordinator for completion.

## 2017-04-27 NOTE — Telephone Encounter (Signed)
Hehe no problem Candise BowensJen, thanks for fixing!

## 2017-04-27 NOTE — Telephone Encounter (Signed)
SOMEBODYYYYYYYYYYYYYYYYYYYYYYYYYYYYYYYYYYYYYYY didn't read my order.  Check it.  It specifically states [facility] and now it looks like I didn't listen when they told me not to use [facility].  Please let family know [facility].

## 2017-06-06 ENCOUNTER — Other Ambulatory Visit: Payer: Self-pay | Admitting: Sports Medicine

## 2017-06-06 ENCOUNTER — Ambulatory Visit (INDEPENDENT_AMBULATORY_CARE_PROVIDER_SITE_OTHER): Payer: Medicare HMO | Admitting: Sports Medicine

## 2017-06-06 DIAGNOSIS — M19011 Primary osteoarthritis, right shoulder: Secondary | ICD-10-CM | POA: Diagnosis not present

## 2017-06-06 MED ORDER — CELECOXIB 200 MG PO CAPS: ORAL_CAPSULE | ORAL | 2 refills | Status: AC

## 2017-06-06 NOTE — Assessment & Plan Note (Signed)
Good response initially to right glenohumeral injection. Had near complete pain relief until recently. Starting Celebrex, failed ibuprofen, diclofenac in the past. He will also continue with more home health physical therapy.

## 2017-06-06 NOTE — Progress Notes (Signed)
  Subjective:    CC: Follow-up right shoulder pain.  HPI: We did a glenohumeral injection at the last visit, for a month he had good relief in pain, with recurrence of pain only recently.  Has not done much physical therapy.  I reviewed the past medical history, family history, social history, surgical history, and allergies today and no changes were needed.  Please see the problem list section below in epic for further details.  Past Medical History: Past Medical History:  Diagnosis Date  . Lumbar degenerative disc disease 05/10/2013  . Memory loss 06/02/2015   Past Surgical History: No past surgical history on file. Social History: Social History   Socioeconomic History  . Marital status: Married    Spouse name: Not on file  . Number of children: Not on file  . Years of education: Not on file  . Highest education level: Not on file  Social Needs  . Financial resource strain: Not on file  . Food insecurity - worry: Not on file  . Food insecurity - inability: Not on file  . Transportation needs - medical: Not on file  . Transportation needs - non-medical: Not on file  Occupational History  . Not on file  Tobacco Use  . Smoking status: Never Smoker  Substance and Sexual Activity  . Alcohol use: Not on file  . Drug use: Not on file  . Sexual activity: Not on file  Other Topics Concern  . Not on file  Social History Narrative  . Not on file   Family History: No family history on file. Allergies: No Known Allergies Medications: See med rec.  Review of Systems: No fevers, chills, night sweats, weight loss, chest pain, or shortness of breath.   Objective:    General: Well Developed, well nourished, and in no acute distress.  Neuro: Alert and oriented x3, extra-ocular muscles intact, sensation grossly intact.  HEENT: Normocephalic, atraumatic, pupils equal round reactive to light, neck supple, no masses, no lymphadenopathy, thyroid nonpalpable.  Skin: Warm and dry, no  rashes. Cardiac: Regular rate and rhythm, no murmurs rubs or gallops, no lower extremity edema.  Respiratory: Clear to auscultation bilaterally. Not using accessory muscles, speaking in full sentences.  Impression and Recommendations:    Primary osteoarthritis of right shoulder Good response initially to right glenohumeral injection. Had near complete pain relief until recently. Starting Celebrex, failed ibuprofen, diclofenac in the past. He will also continue with more home health physical therapy. ___________________________________________ Ihor Austinhomas J. Benjamin Stainhekkekandam, M.D., ABFM., CAQSM. Primary Care and Sports Medicine [facility]  Adjunct Instructor of Family Medicine  [employer]

## 2017-07-17 ENCOUNTER — Ambulatory Visit: Payer: Medicare HMO | Admitting: Sports Medicine

## 2018-08-21 IMAGING — DX DG SHOULDER 2+V*R*
2 series · 2 of 2 positions shown · non-contrast
Comparison: None.

CLINICAL DATA: Right shoulder pain x1 week

EXAM:
RIGHT SHOULDER - 2+ VIEW

[shoulder grashey]
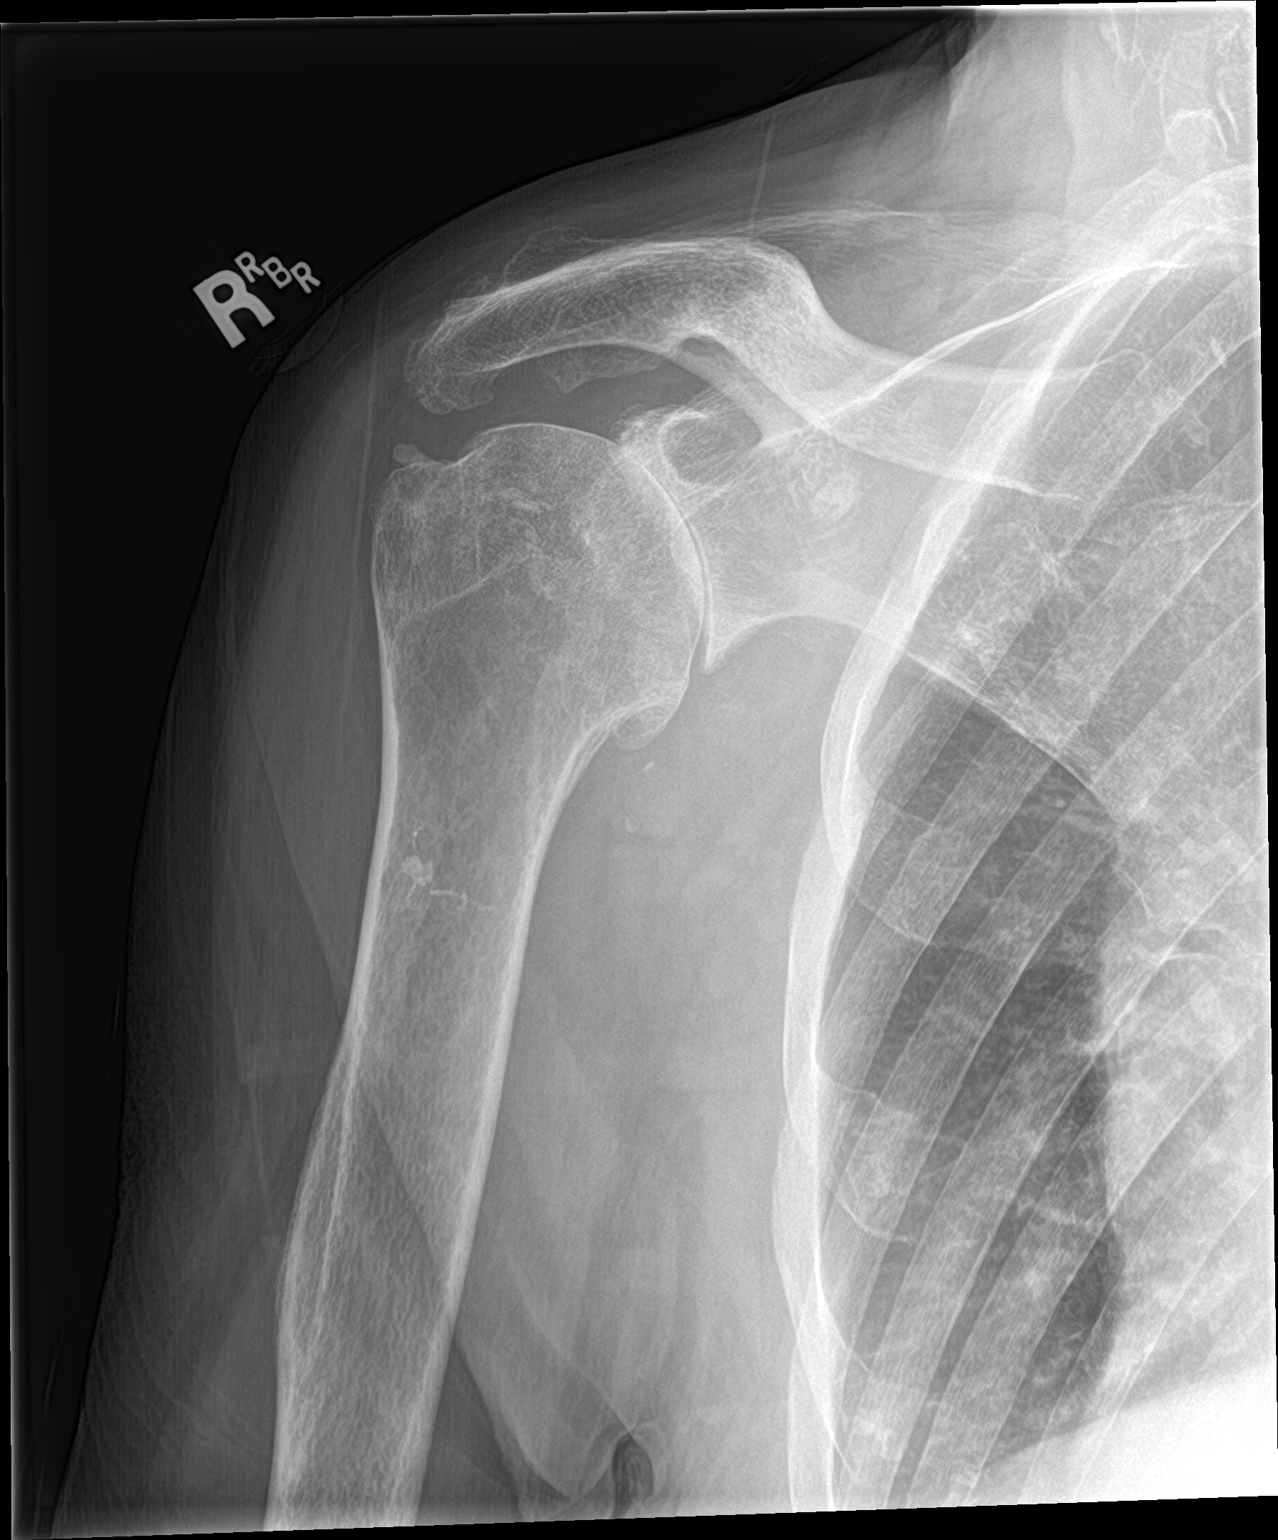

[shoulder y view]
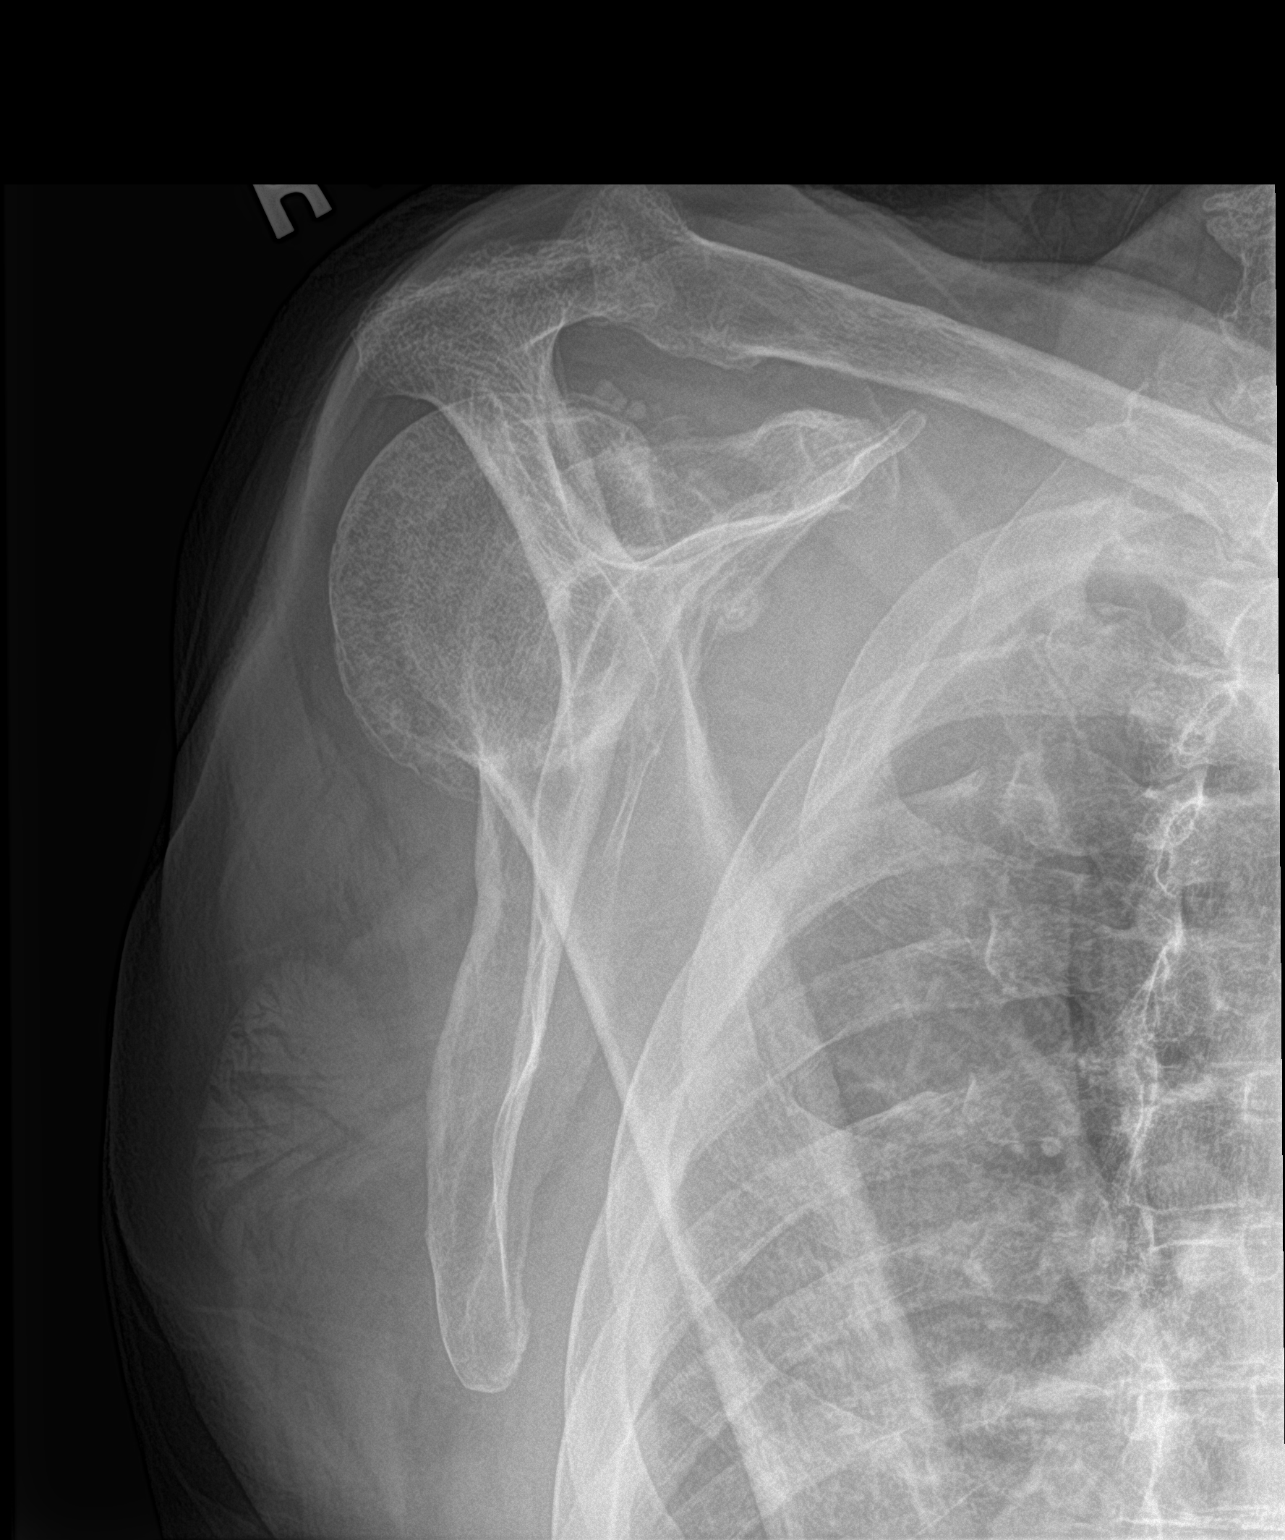

[2 of 2 positions shown; findings below may reference images not displayed]

FINDINGS: No fracture or dislocation is seen.

Mild degenerative changes of the glenohumeral joint. Mild
subacromial spurring.

Visualized soft tissues are within normal limits.

Visualized right lung is clear.
IMPRESSION: Mild degenerative changes.

## 2019-11-27 DEATH — deceased
